# Patient Record
Sex: Male | Born: 1975
Health system: Southern US, Community
[De-identification: ages and names within clinical notes are randomized; demographics above are authoritative.]

## PROBLEM LIST (undated history)

## (undated) DIAGNOSIS — C6290 Malignant neoplasm of unspecified testis, unspecified whether descended or undescended: Secondary | ICD-10-CM

## (undated) DIAGNOSIS — M1990 Unspecified osteoarthritis, unspecified site: Secondary | ICD-10-CM

## (undated) HISTORY — DX: Unspecified osteoarthritis, unspecified site: M19.90

## (undated) HISTORY — DX: Malignant neoplasm of unspecified testis, unspecified whether descended or undescended: C62.90

## (undated) HISTORY — PX: HYDROCELE EXCISION / REPAIR: SUR1145

## (undated) HISTORY — PX: HYPOSPADIAS CORRECTION: SHX483

---
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
## 1997-11-03 ENCOUNTER — Ambulatory Visit (HOSPITAL_COMMUNITY): Admission: RE | Admit: 1997-11-03 | Discharge: 1997-11-03 | Payer: Self-pay | Admitting: Otolaryngology

## 1998-04-21 HISTORY — PX: CHOLESTEATOMA EXCISION: SHX1345

## 2005-04-08 ENCOUNTER — Ambulatory Visit (HOSPITAL_COMMUNITY): Admission: RE | Admit: 2005-04-08 | Discharge: 2005-04-08 | Payer: Self-pay | Admitting: Otolaryngology

## 2005-04-08 ENCOUNTER — Encounter: Admission: RE | Admit: 2005-04-08 | Discharge: 2005-04-08 | Payer: Self-pay | Admitting: Otolaryngology

## 2005-04-11 ENCOUNTER — Encounter (INDEPENDENT_AMBULATORY_CARE_PROVIDER_SITE_OTHER): Payer: Self-pay | Admitting: *Deleted

## 2005-04-11 ENCOUNTER — Ambulatory Visit (HOSPITAL_COMMUNITY): Admission: RE | Admit: 2005-04-11 | Discharge: 2005-04-11 | Payer: Self-pay | Admitting: Otolaryngology

## 2005-04-11 ENCOUNTER — Ambulatory Visit (HOSPITAL_BASED_OUTPATIENT_CLINIC_OR_DEPARTMENT_OTHER): Admission: RE | Admit: 2005-04-11 | Discharge: 2005-04-12 | Payer: Self-pay | Admitting: Otolaryngology

## 2005-12-06 ENCOUNTER — Emergency Department (HOSPITAL_COMMUNITY): Admission: EM | Admit: 2005-12-06 | Discharge: 2005-12-06 | Payer: Self-pay | Admitting: Emergency Medicine

## 2006-04-21 HISTORY — PX: OTHER SURGICAL HISTORY: SHX169

## 2006-07-03 ENCOUNTER — Encounter: Admission: RE | Admit: 2006-07-03 | Discharge: 2006-07-03 | Payer: Self-pay | Admitting: Urology

## 2006-07-07 ENCOUNTER — Encounter (INDEPENDENT_AMBULATORY_CARE_PROVIDER_SITE_OTHER): Payer: Self-pay | Admitting: *Deleted

## 2006-07-07 ENCOUNTER — Ambulatory Visit (HOSPITAL_BASED_OUTPATIENT_CLINIC_OR_DEPARTMENT_OTHER): Admission: RE | Admit: 2006-07-07 | Discharge: 2006-07-07 | Payer: Self-pay | Admitting: Urology

## 2006-07-13 ENCOUNTER — Ambulatory Visit: Payer: Self-pay | Admitting: Oncology

## 2006-08-05 LAB — CBC WITH DIFFERENTIAL/PLATELET
Eosinophils Absolute: 0.1 10*3/uL (ref 0.0–0.5)
MCV: 93.6 fL (ref 81.6–98.0)
MONO#: 0.3 10*3/uL (ref 0.1–0.9)
MONO%: 6 % (ref 0.0–13.0)
NEUT#: 3.8 10*3/uL (ref 1.5–6.5)
RBC: 4.91 10*6/uL (ref 4.20–5.71)
RDW: 14.1 % (ref 11.2–14.6)
WBC: 5.6 10*3/uL (ref 4.0–10.0)

## 2006-08-07 LAB — COMPREHENSIVE METABOLIC PANEL
Albumin: 4.1 g/dL (ref 3.5–5.2)
Alkaline Phosphatase: 135 U/L — ABNORMAL HIGH (ref 39–117)
Glucose, Bld: 98 mg/dL (ref 70–99)
Potassium: 4.4 mEq/L (ref 3.5–5.3)
Sodium: 141 mEq/L (ref 135–145)
Total Protein: 7.2 g/dL (ref 6.0–8.3)

## 2006-08-07 LAB — AFP TUMOR MARKER: AFP-Tumor Marker: 85.8 ng/mL — ABNORMAL HIGH (ref 0.0–8.0)

## 2006-08-12 ENCOUNTER — Ambulatory Visit (HOSPITAL_COMMUNITY): Admission: RE | Admit: 2006-08-12 | Discharge: 2006-08-12 | Payer: Self-pay | Admitting: Oncology

## 2006-08-21 LAB — CBC WITH DIFFERENTIAL/PLATELET
BASO%: 1.6 % (ref 0.0–2.0)
MCHC: 35.4 g/dL (ref 32.0–35.9)
MONO#: 0.3 10*3/uL (ref 0.1–0.9)
NEUT#: 2.6 10*3/uL (ref 1.5–6.5)
RBC: 4.82 10*6/uL (ref 4.20–5.71)
RDW: 14.5 % (ref 11.2–14.6)
WBC: 4.2 10*3/uL (ref 4.0–10.0)
lymph#: 1.1 10*3/uL (ref 0.9–3.3)

## 2006-08-24 LAB — COMPREHENSIVE METABOLIC PANEL
ALT: 31 U/L (ref 0–53)
Albumin: 4 g/dL (ref 3.5–5.2)
CO2: 23 mEq/L (ref 19–32)
Calcium: 9.1 mg/dL (ref 8.4–10.5)
Chloride: 107 mEq/L (ref 96–112)
Potassium: 4.4 mEq/L (ref 3.5–5.3)
Sodium: 143 mEq/L (ref 135–145)
Total Bilirubin: 0.9 mg/dL (ref 0.3–1.2)
Total Protein: 7.1 g/dL (ref 6.0–8.3)

## 2006-08-24 LAB — LACTATE DEHYDROGENASE: LDH: 160 U/L (ref 94–250)

## 2006-08-27 ENCOUNTER — Ambulatory Visit: Payer: Self-pay | Admitting: Oncology

## 2006-09-15 LAB — CBC WITH DIFFERENTIAL/PLATELET
Basophils Absolute: 0 10*3/uL (ref 0.0–0.1)
Eosinophils Absolute: 0 10*3/uL (ref 0.0–0.5)
LYMPH%: 16.6 % (ref 14.0–48.0)
MCH: 32.6 pg (ref 28.0–33.4)
MCV: 92.8 fL (ref 81.6–98.0)
MONO%: 5 % (ref 0.0–13.0)
NEUT#: 6.8 10*3/uL — ABNORMAL HIGH (ref 1.5–6.5)
Platelets: 415 10*3/uL — ABNORMAL HIGH (ref 145–400)
RBC: 4.23 10*6/uL (ref 4.20–5.71)

## 2006-09-18 LAB — COMPREHENSIVE METABOLIC PANEL
Albumin: 3.9 g/dL (ref 3.5–5.2)
Alkaline Phosphatase: 167 U/L — ABNORMAL HIGH (ref 39–117)
BUN: 15 mg/dL (ref 6–23)
Creatinine, Ser: 0.97 mg/dL (ref 0.40–1.50)
Glucose, Bld: 52 mg/dL — ABNORMAL LOW (ref 70–99)
Potassium: 4.3 mEq/L (ref 3.5–5.3)

## 2006-09-18 LAB — BETA HCG QUANT (REF LAB): Beta hCG, Tumor Marker: 1 m[IU]/mL (ref <5.0)

## 2006-09-18 LAB — AFP TUMOR MARKER: AFP-Tumor Marker: 5.5 ng/mL (ref 0.0–8.0)

## 2006-09-20 ENCOUNTER — Observation Stay (HOSPITAL_COMMUNITY): Admission: EM | Admit: 2006-09-20 | Discharge: 2006-09-21 | Payer: Self-pay | Admitting: Emergency Medicine

## 2006-09-20 ENCOUNTER — Ambulatory Visit: Payer: Self-pay | Admitting: Oncology

## 2006-10-02 LAB — CBC WITH DIFFERENTIAL/PLATELET
BASO%: 0.1 % (ref 0.0–2.0)
Basophils Absolute: 0 10*3/uL (ref 0.0–0.1)
EOS%: 0.2 % (ref 0.0–7.0)
HGB: 13.8 g/dL (ref 13.0–17.1)
MCH: 33 pg (ref 28.0–33.4)
MCHC: 34.9 g/dL (ref 32.0–35.9)
RDW: 16.9 % — ABNORMAL HIGH (ref 11.2–14.6)
WBC: 15.9 10*3/uL — ABNORMAL HIGH (ref 4.0–10.0)
lymph#: 1.8 10*3/uL (ref 0.9–3.3)

## 2006-10-04 LAB — COMPREHENSIVE METABOLIC PANEL
ALT: 25 U/L (ref 0–53)
Alkaline Phosphatase: 164 U/L — ABNORMAL HIGH (ref 39–117)
CO2: 30 mEq/L (ref 19–32)
Sodium: 140 mEq/L (ref 135–145)
Total Bilirubin: 0.3 mg/dL (ref 0.3–1.2)
Total Protein: 6.4 g/dL (ref 6.0–8.3)

## 2006-10-04 LAB — LACTATE DEHYDROGENASE: LDH: 209 U/L (ref 94–250)

## 2006-11-03 ENCOUNTER — Ambulatory Visit (HOSPITAL_COMMUNITY): Admission: RE | Admit: 2006-11-03 | Discharge: 2006-11-03 | Payer: Self-pay | Admitting: Oncology

## 2006-11-04 ENCOUNTER — Ambulatory Visit: Payer: Self-pay | Admitting: Oncology

## 2006-11-06 LAB — CBC WITH DIFFERENTIAL/PLATELET
Basophils Absolute: 0 10*3/uL (ref 0.0–0.1)
Eosinophils Absolute: 0 10*3/uL (ref 0.0–0.5)
HGB: 13.6 g/dL (ref 13.0–17.1)
LYMPH%: 29.3 % (ref 14.0–48.0)
MCV: 96 fL (ref 81.6–98.0)
MONO%: 11.9 % (ref 0.0–13.0)
NEUT#: 2.4 10*3/uL (ref 1.5–6.5)
NEUT%: 57.6 % (ref 40.0–75.0)
Platelets: 365 10*3/uL (ref 145–400)

## 2006-11-08 LAB — LACTATE DEHYDROGENASE: LDH: 161 U/L (ref 94–250)

## 2006-11-08 LAB — COMPREHENSIVE METABOLIC PANEL
ALT: 27 U/L (ref 0–53)
Albumin: 4.3 g/dL (ref 3.5–5.2)
Alkaline Phosphatase: 100 U/L (ref 39–117)
BUN: 13 mg/dL (ref 6–23)
Calcium: 9.3 mg/dL (ref 8.4–10.5)
Glucose, Bld: 81 mg/dL (ref 70–99)
Total Bilirubin: 0.4 mg/dL (ref 0.3–1.2)
Total Protein: 6.5 g/dL (ref 6.0–8.3)

## 2006-11-08 LAB — BETA HCG QUANT (REF LAB): Beta hCG, Tumor Marker: 0.7 m[IU]/mL (ref <5.0)

## 2007-01-21 ENCOUNTER — Ambulatory Visit: Payer: Self-pay | Admitting: Oncology

## 2007-01-25 ENCOUNTER — Ambulatory Visit (HOSPITAL_COMMUNITY): Admission: RE | Admit: 2007-01-25 | Discharge: 2007-01-25 | Payer: Self-pay | Admitting: Oncology

## 2007-01-25 LAB — CBC WITH DIFFERENTIAL/PLATELET
BASO%: 1.3 % (ref 0.0–2.0)
EOS%: 1 % (ref 0.0–7.0)
LYMPH%: 38.5 % (ref 14.0–48.0)
MCH: 32.4 pg (ref 28.0–33.4)
MCHC: 35 g/dL (ref 32.0–35.9)
MONO#: 0.2 10*3/uL (ref 0.1–0.9)
Platelets: 274 10*3/uL (ref 145–400)
RBC: 4.82 10*6/uL (ref 4.20–5.71)
WBC: 2.7 10*3/uL — ABNORMAL LOW (ref 4.0–10.0)

## 2007-01-25 LAB — COMPREHENSIVE METABOLIC PANEL
ALT: 36 U/L (ref 0–53)
AST: 41 U/L — ABNORMAL HIGH (ref 0–37)
Alkaline Phosphatase: 113 U/L (ref 39–117)
CO2: 32 mEq/L (ref 19–32)
Sodium: 139 mEq/L (ref 135–145)
Total Bilirubin: 0.5 mg/dL (ref 0.3–1.2)
Total Protein: 7.2 g/dL (ref 6.0–8.3)

## 2007-03-14 ENCOUNTER — Ambulatory Visit: Payer: Self-pay | Admitting: Oncology

## 2007-04-21 ENCOUNTER — Ambulatory Visit: Payer: Self-pay | Admitting: Oncology

## 2007-04-26 ENCOUNTER — Ambulatory Visit (HOSPITAL_COMMUNITY): Admission: RE | Admit: 2007-04-26 | Discharge: 2007-04-26 | Payer: Self-pay | Admitting: Oncology

## 2007-04-26 LAB — COMPREHENSIVE METABOLIC PANEL
Albumin: 4 g/dL (ref 3.5–5.2)
Alkaline Phosphatase: 136 U/L — ABNORMAL HIGH (ref 39–117)
BUN: 12 mg/dL (ref 6–23)
CO2: 32 mEq/L (ref 19–32)
Glucose, Bld: 88 mg/dL (ref 70–99)
Total Bilirubin: 1 mg/dL (ref 0.3–1.2)
Total Protein: 7.7 g/dL (ref 6.0–8.3)

## 2007-04-26 LAB — LACTATE DEHYDROGENASE: LDH: 142 U/L (ref 94–250)

## 2007-04-26 LAB — CBC WITH DIFFERENTIAL/PLATELET
Eosinophils Absolute: 0 10*3/uL (ref 0.0–0.5)
HCT: 46.1 % (ref 38.7–49.9)
LYMPH%: 27.9 % (ref 14.0–48.0)
MONO#: 0.3 10*3/uL (ref 0.1–0.9)
NEUT#: 2.5 10*3/uL (ref 1.5–6.5)
NEUT%: 62.8 % (ref 40.0–75.0)
Platelets: 280 10*3/uL (ref 145–400)
WBC: 4 10*3/uL (ref 4.0–10.0)

## 2007-04-28 LAB — AFP TUMOR MARKER: AFP-Tumor Marker: 3.3 ng/mL (ref 0.0–8.0)

## 2007-06-16 ENCOUNTER — Ambulatory Visit: Payer: Self-pay | Admitting: Oncology

## 2007-07-27 ENCOUNTER — Ambulatory Visit: Payer: Self-pay | Admitting: Oncology

## 2007-07-29 LAB — CBC WITH DIFFERENTIAL/PLATELET
Eosinophils Absolute: 0 10*3/uL (ref 0.0–0.5)
HCT: 44.3 % (ref 38.7–49.9)
LYMPH%: 32.5 % (ref 14.0–48.0)
MCV: 94.7 fL (ref 81.6–98.0)
MONO%: 6.3 % (ref 0.0–13.0)
NEUT#: 2.8 10*3/uL (ref 1.5–6.5)
NEUT%: 60.4 % (ref 40.0–75.0)
Platelets: 225 10*3/uL (ref 145–400)
RBC: 4.68 10*6/uL (ref 4.20–5.71)

## 2007-07-31 LAB — COMPREHENSIVE METABOLIC PANEL
Alkaline Phosphatase: 117 U/L (ref 39–117)
BUN: 16 mg/dL (ref 6–23)
Creatinine, Ser: 0.91 mg/dL (ref 0.40–1.50)
Glucose, Bld: 107 mg/dL — ABNORMAL HIGH (ref 70–99)
Sodium: 141 mEq/L (ref 135–145)
Total Bilirubin: 0.4 mg/dL (ref 0.3–1.2)
Total Protein: 7.2 g/dL (ref 6.0–8.3)

## 2007-07-31 LAB — AFP TUMOR MARKER: AFP-Tumor Marker: 2.6 ng/mL (ref 0.0–8.0)

## 2007-07-31 LAB — LACTATE DEHYDROGENASE: LDH: 148 U/L (ref 94–250)

## 2007-07-31 LAB — BETA HCG QUANT (REF LAB): Beta hCG, Tumor Marker: 0.5 m[IU]/mL (ref <5.0)

## 2007-09-16 ENCOUNTER — Ambulatory Visit: Payer: Self-pay | Admitting: Oncology

## 2007-11-01 ENCOUNTER — Ambulatory Visit: Payer: Self-pay | Admitting: Oncology

## 2007-11-04 LAB — CBC WITH DIFFERENTIAL/PLATELET
Basophils Absolute: 0 10*3/uL (ref 0.0–0.1)
EOS%: 1.4 % (ref 0.0–7.0)
Eosinophils Absolute: 0 10*3/uL (ref 0.0–0.5)
HCT: 42.9 % (ref 38.7–49.9)
HGB: 15 g/dL (ref 13.0–17.1)
MCH: 33.9 pg — ABNORMAL HIGH (ref 28.0–33.4)
NEUT%: 49.3 % (ref 40.0–75.0)
lymph#: 1.5 10*3/uL (ref 0.9–3.3)

## 2007-11-07 LAB — COMPREHENSIVE METABOLIC PANEL
AST: 29 U/L (ref 0–37)
BUN: 16 mg/dL (ref 6–23)
CO2: 30 mEq/L (ref 19–32)
Calcium: 9 mg/dL (ref 8.4–10.5)
Chloride: 103 mEq/L (ref 96–112)
Creatinine, Ser: 1.46 mg/dL (ref 0.40–1.50)
Glucose, Bld: 79 mg/dL (ref 70–99)

## 2007-11-07 LAB — LACTATE DEHYDROGENASE: LDH: 146 U/L (ref 94–250)

## 2007-11-07 LAB — AFP TUMOR MARKER: AFP-Tumor Marker: 2.2 ng/mL (ref 0.0–8.0)

## 2007-11-08 ENCOUNTER — Ambulatory Visit (HOSPITAL_COMMUNITY): Admission: RE | Admit: 2007-11-08 | Discharge: 2007-11-08 | Payer: Self-pay | Admitting: Oncology

## 2007-11-16 ENCOUNTER — Ambulatory Visit (HOSPITAL_COMMUNITY): Admission: RE | Admit: 2007-11-16 | Discharge: 2007-11-16 | Payer: Self-pay | Admitting: Oncology

## 2008-02-08 ENCOUNTER — Ambulatory Visit: Payer: Self-pay | Admitting: Oncology

## 2008-02-10 LAB — CBC WITH DIFFERENTIAL/PLATELET
BASO%: 0.6 % (ref 0.0–2.0)
Eosinophils Absolute: 0 10*3/uL (ref 0.0–0.5)
HCT: 43.3 % (ref 38.7–49.9)
MCHC: 35 g/dL (ref 32.0–35.9)
MONO#: 0.2 10*3/uL (ref 0.1–0.9)
NEUT#: 1.6 10*3/uL (ref 1.5–6.5)
NEUT%: 60.7 % (ref 40.0–75.0)
RBC: 4.42 10*6/uL (ref 4.20–5.71)
WBC: 2.6 10*3/uL — ABNORMAL LOW (ref 4.0–10.0)
lymph#: 0.8 10*3/uL — ABNORMAL LOW (ref 0.9–3.3)

## 2008-02-13 LAB — COMPREHENSIVE METABOLIC PANEL
ALT: 36 U/L (ref 0–53)
CO2: 30 mEq/L (ref 19–32)
Calcium: 9.3 mg/dL (ref 8.4–10.5)
Chloride: 103 mEq/L (ref 96–112)
Glucose, Bld: 62 mg/dL — ABNORMAL LOW (ref 70–99)
Sodium: 142 mEq/L (ref 135–145)
Total Protein: 6.7 g/dL (ref 6.0–8.3)

## 2008-02-13 LAB — BETA HCG QUANT (REF LAB): Beta hCG, Tumor Marker: 0.5 m[IU]/mL (ref <5.0)

## 2008-02-13 LAB — LACTATE DEHYDROGENASE: LDH: 135 U/L (ref 94–250)

## 2008-05-05 ENCOUNTER — Ambulatory Visit: Payer: Self-pay | Admitting: Oncology

## 2008-05-11 ENCOUNTER — Ambulatory Visit (HOSPITAL_COMMUNITY): Admission: RE | Admit: 2008-05-11 | Discharge: 2008-05-11 | Payer: Self-pay | Admitting: Oncology

## 2008-09-11 ENCOUNTER — Ambulatory Visit: Payer: Self-pay | Admitting: Oncology

## 2008-09-13 ENCOUNTER — Ambulatory Visit (HOSPITAL_COMMUNITY): Admission: RE | Admit: 2008-09-13 | Discharge: 2008-09-13 | Payer: Self-pay | Admitting: Oncology

## 2008-09-13 LAB — CBC WITH DIFFERENTIAL/PLATELET
BASO%: 1.1 % (ref 0.0–2.0)
HCT: 43.8 % (ref 38.4–49.9)
LYMPH%: 35.5 % (ref 14.0–49.0)
MCHC: 35.6 g/dL (ref 32.0–36.0)
MCV: 93.8 fL (ref 79.3–98.0)
MONO#: 0.2 10*3/uL (ref 0.1–0.9)
MONO%: 7.5 % (ref 0.0–14.0)
NEUT%: 54.8 % (ref 39.0–75.0)
Platelets: 230 10*3/uL (ref 140–400)
WBC: 2.8 10*3/uL — ABNORMAL LOW (ref 4.0–10.3)

## 2008-09-15 LAB — COMPREHENSIVE METABOLIC PANEL
ALT: 35 U/L (ref 0–53)
CO2: 28 mEq/L (ref 19–32)
Creatinine, Ser: 0.98 mg/dL (ref 0.40–1.50)
Glucose, Bld: 89 mg/dL (ref 70–99)
Total Bilirubin: 0.6 mg/dL (ref 0.3–1.2)

## 2008-09-15 LAB — BETA HCG QUANT (REF LAB): Beta hCG, Tumor Marker: 0.5 m[IU]/mL (ref <5.0)

## 2008-09-15 LAB — LACTATE DEHYDROGENASE: LDH: 150 U/L (ref 94–250)

## 2008-09-15 LAB — AFP TUMOR MARKER: AFP-Tumor Marker: 2.3 ng/mL (ref 0.0–8.0)

## 2009-01-16 ENCOUNTER — Ambulatory Visit: Payer: Self-pay | Admitting: Oncology

## 2009-01-18 ENCOUNTER — Ambulatory Visit (HOSPITAL_COMMUNITY): Admission: RE | Admit: 2009-01-18 | Discharge: 2009-01-18 | Payer: Self-pay | Admitting: Oncology

## 2009-01-18 LAB — CBC WITH DIFFERENTIAL/PLATELET
BASO%: 1.3 % (ref 0.0–2.0)
EOS%: 0.8 % (ref 0.0–7.0)
MCH: 34.1 pg — ABNORMAL HIGH (ref 27.2–33.4)
MCHC: 34.6 g/dL (ref 32.0–36.0)
MONO#: 0.2 10*3/uL (ref 0.1–0.9)
RBC: 4.4 10*6/uL (ref 4.20–5.82)
RDW: 14.1 % (ref 11.0–14.6)
WBC: 2.5 10*3/uL — ABNORMAL LOW (ref 4.0–10.3)
lymph#: 0.9 10*3/uL (ref 0.9–3.3)

## 2009-01-20 LAB — COMPREHENSIVE METABOLIC PANEL
ALT: 50 U/L (ref 0–53)
AST: 39 U/L — ABNORMAL HIGH (ref 0–37)
Albumin: 4.4 g/dL (ref 3.5–5.2)
CO2: 29 mEq/L (ref 19–32)
Calcium: 9.1 mg/dL (ref 8.4–10.5)
Chloride: 103 mEq/L (ref 96–112)
Creatinine, Ser: 0.92 mg/dL (ref 0.40–1.50)
Potassium: 4.7 mEq/L (ref 3.5–5.3)
Sodium: 140 mEq/L (ref 135–145)
Total Protein: 6.6 g/dL (ref 6.0–8.3)

## 2009-04-19 ENCOUNTER — Encounter: Admission: RE | Admit: 2009-04-19 | Discharge: 2009-04-19 | Payer: Self-pay | Admitting: Family Medicine

## 2009-05-15 ENCOUNTER — Ambulatory Visit: Payer: Self-pay | Admitting: Oncology

## 2009-05-17 ENCOUNTER — Ambulatory Visit (HOSPITAL_COMMUNITY): Admission: RE | Admit: 2009-05-17 | Discharge: 2009-05-17 | Payer: Self-pay | Admitting: Oncology

## 2009-05-17 LAB — CBC WITH DIFFERENTIAL/PLATELET
BASO%: 1 % (ref 0.0–2.0)
Basophils Absolute: 0 10*3/uL (ref 0.0–0.1)
EOS%: 1.1 % (ref 0.0–7.0)
HGB: 15.6 g/dL (ref 13.0–17.1)
MCH: 34.3 pg — ABNORMAL HIGH (ref 27.2–33.4)
MCHC: 34.5 g/dL (ref 32.0–36.0)
MCV: 99.3 fL — ABNORMAL HIGH (ref 79.3–98.0)
NEUT#: 1.5 10*3/uL (ref 1.5–6.5)
NEUT%: 55.1 % (ref 39.0–75.0)
Platelets: 252 10*3/uL (ref 140–400)
WBC: 2.8 10*3/uL — ABNORMAL LOW (ref 4.0–10.3)

## 2009-05-20 LAB — COMPREHENSIVE METABOLIC PANEL
AST: 39 U/L — ABNORMAL HIGH (ref 0–37)
Alkaline Phosphatase: 104 U/L (ref 39–117)
BUN: 18 mg/dL (ref 6–23)
Calcium: 9.5 mg/dL (ref 8.4–10.5)
Creatinine, Ser: 0.89 mg/dL (ref 0.40–1.50)
Glucose, Bld: 78 mg/dL (ref 70–99)

## 2009-05-20 LAB — LACTATE DEHYDROGENASE: LDH: 165 U/L (ref 94–250)

## 2009-09-11 ENCOUNTER — Ambulatory Visit: Payer: Self-pay | Admitting: Oncology

## 2009-09-12 ENCOUNTER — Ambulatory Visit (HOSPITAL_COMMUNITY): Admission: RE | Admit: 2009-09-12 | Discharge: 2009-09-12 | Payer: Self-pay | Admitting: Oncology

## 2009-09-12 LAB — CBC WITH DIFFERENTIAL/PLATELET
BASO%: 0.9 % (ref 0.0–2.0)
EOS%: 1.2 % (ref 0.0–7.0)
HCT: 44.1 % (ref 38.4–49.9)
LYMPH%: 27.3 % (ref 14.0–49.0)
MCH: 34.4 pg — ABNORMAL HIGH (ref 27.2–33.4)
MCHC: 35.1 g/dL (ref 32.0–36.0)
MCV: 97.9 fL (ref 79.3–98.0)
MONO#: 0.3 10*3/uL (ref 0.1–0.9)
MONO%: 10.1 % (ref 0.0–14.0)
NEUT%: 60.5 % (ref 39.0–75.0)
Platelets: 215 10*3/uL (ref 140–400)
RBC: 4.5 10*6/uL (ref 4.20–5.82)
WBC: 2.5 10*3/uL — ABNORMAL LOW (ref 4.0–10.3)

## 2009-09-15 LAB — COMPREHENSIVE METABOLIC PANEL
ALT: 33 U/L (ref 0–53)
AST: 38 U/L — ABNORMAL HIGH (ref 0–37)
Alkaline Phosphatase: 86 U/L (ref 39–117)
Creatinine, Ser: 0.93 mg/dL (ref 0.40–1.50)
Sodium: 139 mEq/L (ref 135–145)
Total Bilirubin: 0.6 mg/dL (ref 0.3–1.2)
Total Protein: 6.6 g/dL (ref 6.0–8.3)

## 2009-09-15 LAB — LACTATE DEHYDROGENASE: LDH: 147 U/L (ref 94–250)

## 2010-01-07 ENCOUNTER — Ambulatory Visit: Payer: Self-pay | Admitting: Oncology

## 2010-01-10 LAB — CBC WITH DIFFERENTIAL/PLATELET
BASO%: 1.4 % (ref 0.0–2.0)
Basophils Absolute: 0 10*3/uL (ref 0.0–0.1)
HCT: 45.6 % (ref 38.4–49.9)
HGB: 15.7 g/dL (ref 13.0–17.1)
MONO#: 0.3 10*3/uL (ref 0.1–0.9)
NEUT%: 56.6 % (ref 39.0–75.0)
RDW: 13.8 % (ref 11.0–14.6)
WBC: 3.4 10*3/uL — ABNORMAL LOW (ref 4.0–10.3)
lymph#: 1.1 10*3/uL (ref 0.9–3.3)

## 2010-01-12 LAB — COMPREHENSIVE METABOLIC PANEL
ALT: 28 U/L (ref 0–53)
Albumin: 4.2 g/dL (ref 3.5–5.2)
BUN: 16 mg/dL (ref 6–23)
CO2: 30 mEq/L (ref 19–32)
Calcium: 9.7 mg/dL (ref 8.4–10.5)
Chloride: 103 mEq/L (ref 96–112)
Creatinine, Ser: 0.9 mg/dL (ref 0.40–1.50)
Potassium: 4.5 mEq/L (ref 3.5–5.3)

## 2010-01-12 LAB — LACTATE DEHYDROGENASE: LDH: 155 U/L (ref 94–250)

## 2010-05-07 ENCOUNTER — Ambulatory Visit: Payer: Self-pay | Admitting: Oncology

## 2010-05-09 ENCOUNTER — Ambulatory Visit (HOSPITAL_COMMUNITY)
Admission: RE | Admit: 2010-05-09 | Discharge: 2010-05-09 | Payer: Self-pay | Source: Home / Self Care | Attending: Oncology | Admitting: Oncology

## 2010-05-09 LAB — CBC WITH DIFFERENTIAL/PLATELET
BASO%: 0.8 % (ref 0.0–2.0)
Basophils Absolute: 0 10*3/uL (ref 0.0–0.1)
EOS%: 0.9 % (ref 0.0–7.0)
Eosinophils Absolute: 0 10*3/uL (ref 0.0–0.5)
HCT: 44.9 % (ref 38.4–49.9)
HGB: 15.4 g/dL (ref 13.0–17.1)
LYMPH%: 35.8 % (ref 14.0–49.0)
MCH: 33.8 pg — ABNORMAL HIGH (ref 27.2–33.4)
MCHC: 34.3 g/dL (ref 32.0–36.0)
MCV: 98.4 fL — ABNORMAL HIGH (ref 79.3–98.0)
MONO#: 0.2 10*3/uL (ref 0.1–0.9)
MONO%: 7.5 % (ref 0.0–14.0)
NEUT#: 1.7 10*3/uL (ref 1.5–6.5)
NEUT%: 55 % (ref 39.0–75.0)
Platelets: 256 10*3/uL (ref 140–400)
RBC: 4.57 10*6/uL (ref 4.20–5.82)
RDW: 13.5 % (ref 11.0–14.6)
WBC: 3.1 10*3/uL — ABNORMAL LOW (ref 4.0–10.3)
lymph#: 1.1 10*3/uL (ref 0.9–3.3)

## 2010-05-12 LAB — COMPREHENSIVE METABOLIC PANEL WITH GFR
ALT: 29 U/L (ref 0–53)
AST: 37 U/L (ref 0–37)
Albumin: 4.4 g/dL (ref 3.5–5.2)
Alkaline Phosphatase: 100 U/L (ref 39–117)
BUN: 13 mg/dL (ref 6–23)
CO2: 28 meq/L (ref 19–32)
Calcium: 9.5 mg/dL (ref 8.4–10.5)
Chloride: 102 meq/L (ref 96–112)
Creatinine, Ser: 0.89 mg/dL (ref 0.40–1.50)
Glucose, Bld: 81 mg/dL (ref 70–99)
Potassium: 4.3 meq/L (ref 3.5–5.3)
Sodium: 142 meq/L (ref 135–145)
Total Bilirubin: 0.5 mg/dL (ref 0.3–1.2)
Total Protein: 6.9 g/dL (ref 6.0–8.3)

## 2010-05-12 LAB — LACTATE DEHYDROGENASE: LDH: 150 U/L (ref 94–250)

## 2010-05-12 LAB — AFP TUMOR MARKER: AFP-Tumor Marker: 3.6 ng/mL (ref 0.0–8.0)

## 2010-09-03 NOTE — H&P (Signed)
NAME:  Justin Savage, Justin Savage NO.:  [id number]   MEDICAL RECORD NO.:  [id number]          PATIENT TYPE:  INP   LOCATION:  1316                         FACILITY:  [facility]   PHYSICIAN:  Genene Churn. Granfortuna, M.D.DATE OF BIRTH:  02-Apr-1976   DATE OF ADMISSION:  09/20/2006  DATE OF DISCHARGE:                              HISTORY & PHYSICAL   REASON FOR ADMISSION:  Refractory vomiting and a man on chemotherapy for  recently diagnosed testicular cancer.   HISTORY OF PRESENT ILLNESS:  [name] with a history  of an undescended testicle and Down syndrome.  He presented with a  tender left testicular mass in March of this year.  Ultrasound showed a  7 x 5 x 6 cm solid mass.  Tumor markers were obtained.  Alpha-  fetoprotein was markedly elevated at 3284.  HCG was undetectable.  Serum  LDH was normal.  He underwent a left radical orchiectomy by Dr. Bjorn Pippin on July 07, 2006.  Pathology showed an 8.6 cm mixed germ cell  tumor, 50% embryonal components, 20% seminoma, 20% ILK sac tumor, 10%  immature teratoma.  Positive vascular and lymphatic invasion.  Pathologic T2.  Staging evaluation with CT scan of chest, abdomen and  pelvis showed non bulky minimal periaortic lymphadenopathy with two  lymph nodes 2 x 1.6 and 1.4 cm.  In addition, there was a 1.7 cm left  common femoral lymph node.   A standard chemotherapy program was initiated with cisplatinum,  etoposide, and bleomycin on Aug 24, 2006.  He tolerated the first cycle  well.  A second cycle was given May 27-30, 2008, with Neulasta support.  He now presents with a 24-hour history of delayed vomiting with  persistent dry heaves, refractory to outpatient antiemetics.  He is  admitted for hydration and parenteral antiemetics at this time.   PAST SURGICAL HISTORY:  1. Includes previous surgery for an undescended testicle.  His mother      believes it was the left testicle that was not descended, but she  is not sure.  2. Hypospadias repair.  3. Hydrocele repair.  4. Club foot repair.   PAST SURGICAL HISTORY:  No active medical problems.   MEDICATIONS:  No chronic medications.   ALLERGIES:  No allergies.   FAMILY HISTORY:  Father with a treated prostate cancer.   SOCIAL HISTORY:  He has Down syndrome.  He lives with his parents.  He  is single.  No alcohol or tobacco.   REVIEW OF SYSTEMS:  He has had some dizziness over the last 24 hours.  No fever.  No abdominal pain.  He has had some intermittent diarrhea  over the last 24 hours.   PHYSICAL EXAMINATION:  GENERAL:  [name] who is retching during  the exam.  VITAL SIGNS:  Pulse 101 regular, blood pressure 118/78, respirations 22,  temperature 97.6.  HEENT:  Skin, hair and nails normal.  Pupils equal, reactive to light.  Pharynx no erythema or exudate.  NECK:  Supple.  LUNGS:  Clear, resonant to percussion.  HEART:  Regular cardiac rhythm.  No  murmur.  ABDOMEN:  Soft and nontender.  No masses or organomegaly.  Scar in the  left inguinal region status post orchiectomy.  EXTREMITIES:  No edema.  No calf tenderness.  NEUROLOGIC: Cranial nerves grossly normal motor strength 5/5.  Reflexes  1+ symmetric.   LABORATORY DATA:  Labs in our office this Tuesday on May 27 with  hemoglobin 14, white count 8700, platelets 415,000, BUN 15, creatinine  1.0.   IMPRESSION:  1. Delayed nausea and vomiting from recent chemotherapy for testicular      cancer.  2. 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  3. Down syndrome.   PLAN:  Parenteral hydration and antiemetics until stable.  Add an  antiemetic that may prevent delayed nausea and vomiting for his next  cycle such as Aloxi or Emend.      Genene Churn. Cyndie Chime, M.D.  Electronically Signed     JMG/MEDQ  D:  09/20/2006  T:  09/21/2006  Job:  [id number]   cc:   Oley Balm. Georgina Pillion, M.D.  Fax: [phone]   Excell Seltzer. Annabell Howells, M.D.  Fax: [phone]   Blenda Nicely. [employer]

## 2010-09-06 NOTE — Op Note (Signed)
NAME:  Justin Savage, Justin Savage            ACCOUNT NO.:  [id number]   MEDICAL RECORD NO.:  [id number]          PATIENT TYPE:  AMB   LOCATION:  NESC                         FACILITY:  [facility]   PHYSICIAN:  Excell Seltzer. Annabell Howells, M.D.    DATE OF BIRTH:  28-Mar-1976   DATE OF PROCEDURE:  07/07/2006  DATE OF DISCHARGE:                               OPERATIVE REPORT   PROCEDURE:  Left radical orchiectomy.   PREOPERATIVE DIAGNOSIS:  Left testicular tumor.   POSTOPERATIVE DIAGNOSIS:  Left testicular tumor.   SURGEON:  Excell Seltzer. Annabell Howells, M.D.   ANESTHESIA:  General.   SPECIMEN:  Left testicle and cord.   COMPLICATIONS:  None.   INDICATIONS:  Justin Savage is a 35 year old white male with Down's who noticed a  swelling and discomfort in his left scrotum in mid March.  He had not  noticed any abnormality prior, but in the office he was found to have a  large testicular mass which was confirmed to be solid on ultrasound  suspicious for neoplasm.  He has had tumor markers drawn. 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   FINDINGS AND PROCEDURE:  The patient was taken to the operating room  where general anesthetic was induced.  His lower abdomen and genitalia  were clipped.  He was prepped with Betadine solution and draped in the  usual sterile fashion.  An incision was made obliquely along the skin  lines over the left inguinal area approximately 5 cm in length.  Once  the skin was incised, the subcutaneous tissues were incised with the  Bovie.  A large superficial vein was clamped, divided, and tied with 3-0  chromic ties.  There had been prior surgery in the area for possibly an  orchiopexy, so the external oblique fascia was not completely normal;  however, cord was identified inferior to the external ring and encircled  and then clamped with a Penrose drain.  The testicle was then delivered  from the wound.   The gubernacular attachments were taken down with the  Bovie.  There were several large veins that were ligated with 3-0  chromic ties.  Once the testicle was freed up, we dissected proximally  toward the internal ring.  There was some scarring and old stitches  noted from his prior surgery.  The ilioinguinal nerve was identified and  was spared. Once the proximal dissection had been completed, the cord  was divided into 3 packets, clamped with hemostats, divided, and doubly  ligated with 0 Vicryl ties.  The ends were left long for future  identification if necessary.  At this point, the wound was inspected for  hemostasis.  No active bleeding was identified.  The external oblique  fascia was approximated as best as possible with interrupted 3-0 Vicryl  sutures, it was somewhat attenuated from the prior surgery.  10 mL of  0.25% Marcaine was then infiltrated into the margins of the incision.  A  subcutaneous layer of interrupted 3-0 Vicryl was placed.  The skin was  closed with running intracuticular 4-0 Vicryl.  The wound was  cleansed, dried, and reinforced with Steri-Strips. A dressing was  applied along with fluffed Kerlix and scrotal support.  The patient's  anesthetic was reversed and he was taken to the recovery room in stable  condition.  There were no complications.      Excell Seltzer. Annabell Howells, M.D.  Electronically Signed     JJW/MEDQ  D:  07/07/2006  T:  07/07/2006  Job:  [id number]   cc:   Oley Balm. Georgina Pillion, M.D.  Fax: [phone]

## 2010-09-06 NOTE — Op Note (Signed)
NAME:  Justin Savage, Justin Savage NO.:  [id number]   MEDICAL RECORD NO.:  [id number]          PATIENT TYPE:  AMB   LOCATION:  DSC                          FACILITY:  [facility]   PHYSICIAN:  Carolan Shiver, M.D.    DATE OF BIRTH:  1976-03-26   DATE OF PROCEDURE:  04/11/2005  DATE OF DISCHARGE:  04/11/2005                                 OPERATIVE REPORT   JUSTIFICATION FOR PROCEDURE:  Justin Savage is a 35 year old white  male with trisomy 21 whom I have been following for a long history of  chronic ear disease. He was recently found to have a left attic  cholesteatoma and this was documented on CT scan of his temporal bones. He  was recommended for a left canal up tympanomastoidectomy and possible  modified radical mastoidectomy, left ear. Preop audiometric testing on  April 08, 2005 documented an SRT of 25 dB with 100% discrimination. 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   PREOP LABORATORY DATA:  Showed hemoglobin of 16.6, hematocrit of 48.9, white  blood cell count of 4,000, PT of 13.6, INR 1.0, PTT 29. Metabolic panel  showed a slightly elevated potassium of 5.6, glucose of 101, SGPT of 45.  Urinalysis was unremarkable. He was known to have a hypospadias.   Risks and complications of tympanomastoidectomy were explained to his  mother. Questions were invited and answered and informed consent was signed  and witnessed.   Justification for outpatient setting is patient's age and need for general  endotracheal anesthesia.   Justification for overnight stay is:  1.  Twenty-three hours of observation of facial function status post      tympanomastoidectomy.  2.  The patient has trisomy 48.   PREOPERATIVE DIAGNOSES:  1.  Left middle ear and attic cholesteatoma.  2.  Trisomy 21.   POSTOPERATIVE DIAGNOSES:  1.  Left middle ear and attic cholesteatoma.  2.  Trisomy 21.   OPERATION:  Left canal up tympanomastoidectomy with excision of middle ear  and  attic cholesteatoma.   SURGEON:  Carolan Shiver, M.D.   ANESTHESIA:  General endotracheal, Dr. Adonis Huguenin.   COMPLICATIONS:  None.   SUMMARY OF REPORT:  After the patient was taken to the operating room he was  placed in the supine position and time out was performed. General mask  induction was performed and an IV was inserted. He was then orally  intubated, eyelids were taped shut, and he was properly positioned and  monitored. Elbows and ankles were padded with foam rubber. Silver wire and  needle electrodes were placed in the left orbicularis auris and oculi  muscles suprasternal notch area and connected to a NIM facial nerve monitor  preamplifier. A small amount of hair was clipped in the left postauricular  area. His hair was taped and stockinette cap was applied and his left  postauricular area was then marked and infiltrated with 5 mL of 1% Xylocaine  with 1:100,000 epinephrine. A #10 red rubber catheter was then inserted. The  patient was found to have a hypospadias condition. This was done  atraumatically.  The patient's left ear and hemiface was prepped with  Betadine and draped in a standard fashion for a tympanomastoidectomy.   Examination of the left ear canal revealed a 4 mm diameter canal. There was  a large amount of debris. This was cleaned. There was cholesteatoma debris  in the posterior superior quadrant and extending into the attic there was  some scutal erosion present. The area was meticulously cleaned and debrided.  Four quadrant ear canal blocks were performed with 1 mL of 2% Xylocaine with  1:50,000 epinephrine.   Left postauricular incision was then made and carried down through skin and  subcutaneous tissue. A T-shaped incision was made in the mastoid periosteum  and reflected. The mastoid cortex was then removed with cutting burs using  continuous suction irrigation. The patient was found to have a completely  sclerotic mastoid cavity. The complete  mastoidectomy was performed. The  Korner septum was found and removed. The inferior one-third of the mastoid  was filled with marrow. A complete mastoidectomy again was performed  removing the sclerotic bone and identifying the posterior canal wall, the  sigmoid sinus, and the digastric periosteum. There was a small amount of  dura exposed in the attic from some chronic granulation tissue that was  found in the antrum. This was removed. The dome of the lateral canal was  identified as was the short process of the incus. There was no cholesteatoma  found in the attic proper.   Posterior canal wall skin was then dissected medially. A canalplasty was  performed superiorly. Posterior superior bony canal wall was then curetted.  There was already some scutal erosion present. Middle ear was entered  without difficulty. The patient was found to have a type 3  myringostapediopexy. This was not disturbed. The facial nerve was identified  in the horizontal portion and was covered by bone. Reflecting the posterior  canal wall skin and looking through the ear canal again I could see that  there was a pocket that had extended into the attic up to the body of the  incus. The entire long process of the incus was gone. The pocket had  extended up medial to the partially eroded scutum and was abutting against  the eroded incus. I was able to remove the cholesteatoma from the pocket.  The cholesteatoma had not broken through into the attic. The body of the  incus was left attached to the malleus so as not to create more of an attic  defect. Once all the cholesteatoma had been cleaned the posterior canal wall  skin and tympanic membrane were allowed to lay back into their previous  position. There were no perforations. The area was copiously irrigated and  the medial portion of the canal was packed with Gelfoam disc soaked in Cipro HC. I should mention that the inferior attic as viewed through the canal   approach also did not show any cholesteatoma and saline irrigated freely  from the middle ear into the attic.   Some bleeding was controlled in the digastric tip with some bone wax. The  cavity was copiously irrigated with bacitracin containing saline as the  Penrose drain was placed. The medial ear canal was then packed with Gelfoam  disc soaked in Cipro HC and lateral ear canal was packed with quarter inch  iodoform Nugauze impregnated with bacitracin ointment. Postauricular  incision was then closed in 3 layers using interrupted inverted 3-0 chromics  for the mucoperiosteal layer, the same for the subcutaneous  layer, and skin  was closed with a running locking 5-0 Novofil. Prior to closing the skin and  the subcutaneous tissue the area was copiously irrigated with bacitracin  containing saline. Bacitracin ointment was applied to the incision. The area  was padded with Telfa and cotton and standard Glasscock mastoid dressing was  applied loosely in a standard fashion. The patient's red rubber catheter was  then removed from his bladder. He was awakened, extubated, and transferred  to his hospital bed. He appeared to tolerate the general endotracheal  anesthesia and the procedures well and left the operating room in stable  condition.   TOTAL FLUIDS:  1500 mL.   ESTIMATED BLOOD LOSS:  Less than 50 mL.   Sponge, needle, and cottonball counts were correct at termination of the  procedure.   Total urine output was 200 mL.   The patient received Ancef 1 gram IV, Zofran 4 mg IV at the beginning and  end of the procedure, and Decadron 10 mg IV.   Justin Savage will be admitted to the 23 hour operative recovery care unit for IV  hydration, pain control, and 23 hours of observation. If stable overnight he  will be discharged on April 12, 2005 with his parents who will be  instructed to return him to my office on April 18, 2005 at 3:40 p.m. for  follow up and suture removal.   DISCHARGE  MEDICATIONS:  1.  Ceftin 500 mg p.o. b.i.d. x10 days with food.  2.  Vicodin 1 p.o. q.6h. p.r.n. pain, #30.  3.  Cipro HC 3 drops left ear t.i.d. x7 days.  4.  Phenergan suppositories 25 mg q.6h. p.r.n. nausea.   He is to follow a regular diet, keep his head elevated, avoid aspirin and  aspirin products, and avoid water exposure to left ear for 3 weeks. His  parents will be given both verbal and written instructions.   SUMMARY OF FINDINGS:  The patient was found to have attic cholesteatoma in a  deep superior retraction pocket. The cholesteatoma was extending to an  eroded incus. The body of the incus was left in place. Mastoid was found to  be sclerotic with granulation tissue in the antrum. There was no  cholesteatoma found in the attic proper. There was some exposed dura at the  tegmen tympani. A type 3 myringostapediopexy was left. The facial nerve was covered by bone in the horizontal portion and was not exposed in the  vertical descending portion. The dome of the lateral canal was in normal  position.           ______________________________  Carolan Shiver, M.D.     EMK/MEDQ  D:  04/11/2005  T:  04/14/2005  Job:  [id number]   cc:   Carolan Shiver, M.D.  Fax: [phone]

## 2010-11-06 ENCOUNTER — Other Ambulatory Visit: Payer: Self-pay | Admitting: Oncology

## 2010-11-06 ENCOUNTER — Encounter (HOSPITAL_BASED_OUTPATIENT_CLINIC_OR_DEPARTMENT_OTHER): Payer: Medicare Other | Admitting: Oncology

## 2010-11-06 DIAGNOSIS — C6290 Malignant neoplasm of unspecified testis, unspecified whether descended or undescended: Secondary | ICD-10-CM

## 2010-11-06 DIAGNOSIS — D72819 Decreased white blood cell count, unspecified: Secondary | ICD-10-CM

## 2010-11-06 LAB — CBC WITH DIFFERENTIAL/PLATELET
Basophils Absolute: 0 10*3/uL (ref 0.0–0.1)
Eosinophils Absolute: 0.1 10*3/uL (ref 0.0–0.5)
HGB: 15.1 g/dL (ref 13.0–17.1)
LYMPH%: 31.7 % (ref 14.0–49.0)
MCV: 98.1 fL — ABNORMAL HIGH (ref 79.3–98.0)
MONO#: 0.3 10*3/uL (ref 0.1–0.9)
MONO%: 7.6 % (ref 0.0–14.0)
NEUT#: 2.4 10*3/uL (ref 1.5–6.5)
Platelets: 243 10*3/uL (ref 140–400)
RBC: 4.42 10*6/uL (ref 4.20–5.82)
WBC: 4.1 10*3/uL (ref 4.0–10.3)

## 2010-11-08 LAB — COMPREHENSIVE METABOLIC PANEL
Albumin: 3.9 g/dL (ref 3.5–5.2)
Alkaline Phosphatase: 104 U/L (ref 39–117)
BUN: 17 mg/dL (ref 6–23)
CO2: 26 mEq/L (ref 19–32)
Glucose, Bld: 111 mg/dL — ABNORMAL HIGH (ref 70–99)
Potassium: 4.3 mEq/L (ref 3.5–5.3)
Sodium: 140 mEq/L (ref 135–145)
Total Bilirubin: 0.4 mg/dL (ref 0.3–1.2)
Total Protein: 6.7 g/dL (ref 6.0–8.3)

## 2010-11-08 LAB — BETA HCG QUANT (REF LAB): Beta hCG, Tumor Marker: <0.5 m[IU]/mL (ref <5.0)

## 2010-11-08 LAB — AFP TUMOR MARKER: AFP-Tumor Marker: 3.3 ng/mL (ref 0.0–8.0)

## 2010-11-13 ENCOUNTER — Encounter (HOSPITAL_BASED_OUTPATIENT_CLINIC_OR_DEPARTMENT_OTHER): Payer: Medicare Other | Admitting: Oncology

## 2010-11-13 DIAGNOSIS — D72819 Decreased white blood cell count, unspecified: Secondary | ICD-10-CM

## 2010-11-13 DIAGNOSIS — C6290 Malignant neoplasm of unspecified testis, unspecified whether descended or undescended: Secondary | ICD-10-CM

## 2011-02-06 LAB — COMPREHENSIVE METABOLIC PANEL
ALT: 65 — ABNORMAL HIGH
AST: 31
Albumin: 3.2 — ABNORMAL LOW
Alkaline Phosphatase: 156 — ABNORMAL HIGH
CO2: 27
Chloride: 101
Creatinine, Ser: 0.9
GFR calc Af Amer: >60
GFR calc non Af Amer: >60
Potassium: 4
Total Bilirubin: 0.9

## 2011-02-06 LAB — CBC
HCT: 40.9
Hemoglobin: 14.1
MCHC: 34.6
MCV: 92.9
MCV: 93.9
Platelets: 536 — ABNORMAL HIGH
RBC: 4.35
RBC: 4.5
WBC: 55.7
WBC: 57.6

## 2011-04-18 ENCOUNTER — Telehealth: Payer: Self-pay | Admitting: *Deleted

## 2011-04-28 ENCOUNTER — Telehealth: Payer: Self-pay | Admitting: Oncology

## 2011-04-28 NOTE — Telephone Encounter (Signed)
lmonvm adviisng the pt of his chest x-ray appt as a walkin any day prior to the appt with dr Clelia Croft

## 2011-05-01 DIAGNOSIS — Z23 Encounter for immunization: Secondary | ICD-10-CM | POA: Diagnosis not present

## 2011-05-19 ENCOUNTER — Encounter: Payer: Self-pay | Admitting: *Deleted

## 2011-05-26 ENCOUNTER — Ambulatory Visit (HOSPITAL_COMMUNITY)
Admission: RE | Admit: 2011-05-26 | Discharge: 2011-05-26 | Disposition: A | Discharge status: Home / Self Care | Payer: Medicare Other | Source: Ambulatory Visit | Attending: Oncology | Admitting: Oncology

## 2011-05-26 ENCOUNTER — Telehealth: Payer: Self-pay | Admitting: *Deleted

## 2011-05-26 DIAGNOSIS — Z8547 Personal history of malignant neoplasm of testis: Secondary | ICD-10-CM | POA: Insufficient documentation

## 2011-05-26 DIAGNOSIS — C6290 Malignant neoplasm of unspecified testis, unspecified whether descended or undescended: Secondary | ICD-10-CM

## 2011-05-26 NOTE — Telephone Encounter (Signed)
Nedra Hai from Radiology called. Patient there for CXR withno orders.  Noted in [place] on P.O.F. Dated 11-13-2010 for pt. To have a PA/Lat cxr Jan. 29, 2013.  Will enter this order in EPIC.

## 2011-05-29 ENCOUNTER — Ambulatory Visit (HOSPITAL_BASED_OUTPATIENT_CLINIC_OR_DEPARTMENT_OTHER): Payer: Medicare Other | Admitting: Oncology

## 2011-05-29 ENCOUNTER — Other Ambulatory Visit (HOSPITAL_BASED_OUTPATIENT_CLINIC_OR_DEPARTMENT_OTHER): Payer: Medicare Other | Admitting: Lab

## 2011-05-29 ENCOUNTER — Telehealth: Payer: Self-pay | Admitting: Oncology

## 2011-05-29 VITALS — BP 106/58 | HR 55 | Temp 99.3°F | Ht 61.5 in | Wt 114.8 lb

## 2011-05-29 DIAGNOSIS — Z9221 Personal history of antineoplastic chemotherapy: Secondary | ICD-10-CM | POA: Diagnosis not present

## 2011-05-29 DIAGNOSIS — Z8547 Personal history of malignant neoplasm of testis: Secondary | ICD-10-CM

## 2011-05-29 DIAGNOSIS — C6290 Malignant neoplasm of unspecified testis, unspecified whether descended or undescended: Secondary | ICD-10-CM | POA: Diagnosis not present

## 2011-05-29 DIAGNOSIS — D72819 Decreased white blood cell count, unspecified: Secondary | ICD-10-CM

## 2011-05-29 LAB — CBC WITH DIFFERENTIAL/PLATELET
BASO%: 0.8 % (ref 0.0–2.0)
EOS%: 0.5 % (ref 0.0–7.0)
HCT: 45.5 % (ref 38.4–49.9)
LYMPH%: 29 % (ref 14.0–49.0)
MCH: 33.9 pg — ABNORMAL HIGH (ref 27.2–33.4)
MCHC: 34.6 g/dL (ref 32.0–36.0)
MCV: 97.8 fL (ref 79.3–98.0)
MONO#: 0.2 10*3/uL (ref 0.1–0.9)
MONO%: 6.6 % (ref 0.0–14.0)
NEUT%: 63.1 % (ref 39.0–75.0)
Platelets: 241 10*3/uL (ref 140–400)
RBC: 4.65 10*6/uL (ref 4.20–5.82)

## 2011-05-29 LAB — COMPREHENSIVE METABOLIC PANEL
ALT: 28 U/L (ref 0–53)
Alkaline Phosphatase: 118 U/L — ABNORMAL HIGH (ref 39–117)
CO2: 33 mEq/L — ABNORMAL HIGH (ref 19–32)
Creatinine, Ser: 0.91 mg/dL (ref 0.50–1.35)
Total Bilirubin: 0.4 mg/dL (ref 0.3–1.2)

## 2011-05-29 LAB — LACTATE DEHYDROGENASE: LDH: 184 U/L (ref 94–250)

## 2011-05-29 NOTE — Telephone Encounter (Signed)
6/6 appt made and printed  aom

## 2011-05-29 NOTE — Progress Notes (Signed)
Hematology and Oncology Follow Up Visit  JAVAUN DIMPERIO [id number] 07-23-1975 36 y.o. 05/29/2011 4:26 PM  CC: Excell Seltzer. Annabell Howells, M.D.  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   Prior Therapy: 1. He is status post left orchiectomy done in March 2008.  Pathology revealed nonseminomatous testis cancer, 50% embryonal, 20% yolk-sac tumor.  He had an elevated alpha-fetoprotein.  CT scan showed a borderline 1.5-cm periaortic lymph node.  2. Patient treated with systemic chemotherapy utilizing cisplatin and etoposide.  Therapy concluded in June 2008 with normalization of his tumor markers without any evidence of anything to suggest recurrent disease.  Now he is over 4 years out from conclusion of his therapy.   Interim History:  Justin Savage presents today for a followup visit.  A very pleasant 36 year old Down syndrome gentleman who is currently on active surveillance from his cancer standpoint.  He had not reported any major changes since the last time I saw him.  He has continued to be extremely active and predominantly his exercise is including running, and biking.  He had not reported any abdominal pain.  He did not report any shortness of breath.  Did not report really any major change in his clinical status or activity.  He has continued to have an excellent performance status, excellent appetite really without any changes or deterioration in his health.  Medications: I have reviewed the patient's current medications. Current outpatient prescriptions:glucosamine-chondroitin 500-400 MG tablet, Take 1 tablet by mouth 3 (three) times daily., Disp: , Rfl: ;  Probiotic Product (PRO-BIOTIC BLEND PO), Take by mouth., Disp: , Rfl: ;  Multiple Vitamin (MULTIVITAMINS PO), Take by mouth daily., Disp: , Rfl:   Allergies: No Known Allergies  Past Medical History, Surgical history, Social history, and  Family History were reviewed and updated.  Review of Systems: Constitutional:  Negative for fever, chills, night sweats, anorexia, weight loss, pain. Cardiovascular: no chest pain or dyspnea on exertion Respiratory: no cough, shortness of breath, or wheezing Neurological: no TIA or stroke symptoms Dermatological: negative ENT: negative Skin: Negative. Gastrointestinal: no abdominal pain, change in bowel habits, or black or bloody stools Genito-Urinary: no dysuria, trouble voiding, or hematuria Hematological and Lymphatic: negative Breast: negative Musculoskeletal: negative Remaining ROS negative. Physical Exam: Blood pressure 106/58, pulse 55, temperature 99.3 F (37.4 C), height 5' 1.5" (1.562 m), weight 114 lb 12.8 oz (52.073 kg). ECOG:  General appearance: alert Head: Normocephalic, without obvious abnormality, atraumatic Neck: no adenopathy, no carotid bruit, no JVD, supple, symmetrical, trachea midline and thyroid not enlarged, symmetric, no tenderness/mass/nodules Lymph nodes: Cervical, supraclavicular, and axillary nodes normal. Heart:regular rate and rhythm, S1, S2 normal, no murmur, click, rub or gallop Lung:chest clear, no wheezing, rales, normal symmetric air entry Abdomin: soft, non-tender, without masses or organomegaly EXT:no erythema, induration, or nodules   Lab Results: Lab Results  Component Value Date   WBC 3.3* 05/29/2011   HGB 15.7 05/29/2011   HCT 45.5 05/29/2011   MCV 97.8 05/29/2011   PLT 241 05/29/2011   Results for MARLOW, BERENGUER (MRN [id number]) as of 05/29/2011 16:27  Ref. Range 11/06/2010 13:46  Sodium Latest Range: 135-145 mEq/L 140  Potassium Latest Range: 3.5-5.3 mEq/L 4.3  Chloride Latest Range: 96-112 mEq/L 103  CO2 Latest Range: 19-32 mEq/L 26  BUN Latest Range: 6-23 mg/dL 17  Creat Latest Range: 0.50-1.35 mg/dL 7.82  Calcium Latest Range: 8.4-10.5 mg/dL 8.7  Glucose  Latest Range: 70-99 mg/dL 161 (H)  Alkaline Phosphatase Latest Range: 39-117  U/L 104  Albumin Latest Range: 3.5-5.2 g/dL 3.9  AST Latest Range: 0-37 U/L 28  ALT Latest Range: 0-53 U/L 24  Total Protein Latest Range: 6.0-8.3 g/dL 6.7  Total Bilirubin Latest Range: 0.3-1.2 mg/dL 0.4    Radiological Studies:  HEST - 2 VIEW 05/26/2011 Comparison: 05/09/2010  Findings: Lungs are clear. No pleural effusion or pneumothorax.  Cardiomediastinal silhouette is within normal limits.  Visualized osseous structures are within normal limits.  IMPRESSION:  No evidence of acute cardiopulmonary disease.    Impression and Plan: A 36 year old gentleman with the following issues:  1. Stage IIA nonseminomatous testis cancer.  Status post orchiectomy, followed by chemotherapy utilizing cisplatin and etoposide.  Therapy concluded in 2008 without any evidence to suggest recurrent disease.  Since the last time I saw him, he has not had anything to suggest relapse at this time, so will continue to have active surveillance and followup clinically with tumor markers every 6 months.  I will obtain a chest x-ray in January   2014.   2.     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  [facility], MD 2/7/20134:26 PM

## 2011-06-02 DIAGNOSIS — H25099 Other age-related incipient cataract, unspecified eye: Secondary | ICD-10-CM | POA: Diagnosis not present

## 2011-07-03 DIAGNOSIS — Z0000 Encounter for general adult medical examination without abnormal findings: Secondary | ICD-10-CM | POA: Diagnosis not present

## 2011-07-03 DIAGNOSIS — Z136 Encounter for screening for cardiovascular disorders: Secondary | ICD-10-CM | POA: Diagnosis not present

## 2011-07-03 DIAGNOSIS — Z1329 Encounter for screening for other suspected endocrine disorder: Secondary | ICD-10-CM | POA: Diagnosis not present

## 2011-07-03 DIAGNOSIS — Z23 Encounter for immunization: Secondary | ICD-10-CM | POA: Diagnosis not present

## 2011-07-03 DIAGNOSIS — C6290 Malignant neoplasm of unspecified testis, unspecified whether descended or undescended: Secondary | ICD-10-CM | POA: Diagnosis not present

## 2011-07-03 DIAGNOSIS — R143 Flatulence: Secondary | ICD-10-CM | POA: Diagnosis not present

## 2011-07-03 DIAGNOSIS — Q909 Down syndrome, unspecified: Secondary | ICD-10-CM | POA: Diagnosis not present

## 2011-07-03 DIAGNOSIS — Z131 Encounter for screening for diabetes mellitus: Secondary | ICD-10-CM | POA: Diagnosis not present

## 2011-07-03 DIAGNOSIS — H6120 Impacted cerumen, unspecified ear: Secondary | ICD-10-CM | POA: Diagnosis not present

## 2011-07-29 DIAGNOSIS — M2170 Unequal limb length (acquired), unspecified site: Secondary | ICD-10-CM | POA: Diagnosis not present

## 2011-09-17 ENCOUNTER — Telehealth: Payer: Self-pay | Admitting: Oncology

## 2011-09-17 NOTE — Telephone Encounter (Signed)
Moved 6/6 appt to 6/7 due to survivor day. lmonvm informing pt and mailed new schedule

## 2011-09-25 ENCOUNTER — Other Ambulatory Visit: Payer: Medicare Other | Admitting: Lab

## 2011-09-25 ENCOUNTER — Ambulatory Visit: Payer: Medicare Other | Admitting: Oncology

## 2011-09-26 ENCOUNTER — Telehealth: Payer: Self-pay | Admitting: Oncology

## 2011-09-26 ENCOUNTER — Ambulatory Visit (HOSPITAL_BASED_OUTPATIENT_CLINIC_OR_DEPARTMENT_OTHER): Payer: Medicare Other | Admitting: Oncology

## 2011-09-26 ENCOUNTER — Other Ambulatory Visit (HOSPITAL_BASED_OUTPATIENT_CLINIC_OR_DEPARTMENT_OTHER): Payer: Medicare Other | Admitting: Lab

## 2011-09-26 VITALS — BP 104/57 | HR 58 | Temp 97.1°F | Ht 61.5 in | Wt 115.2 lb

## 2011-09-26 DIAGNOSIS — T451X5A Adverse effect of antineoplastic and immunosuppressive drugs, initial encounter: Secondary | ICD-10-CM | POA: Diagnosis not present

## 2011-09-26 DIAGNOSIS — D72819 Decreased white blood cell count, unspecified: Secondary | ICD-10-CM | POA: Diagnosis not present

## 2011-09-26 DIAGNOSIS — C6290 Malignant neoplasm of unspecified testis, unspecified whether descended or undescended: Secondary | ICD-10-CM | POA: Diagnosis not present

## 2011-09-26 LAB — CBC WITH DIFFERENTIAL/PLATELET
Eosinophils Absolute: 0 10*3/uL (ref 0.0–0.5)
HGB: 16.2 g/dL (ref 13.0–17.1)
LYMPH%: 30.6 % (ref 14.0–49.0)
MONO#: 0.2 10*3/uL (ref 0.1–0.9)
NEUT#: 1.8 10*3/uL (ref 1.5–6.5)
Platelets: 246 10*3/uL (ref 140–400)
RBC: 4.75 10*6/uL (ref 4.20–5.82)
RDW: 13.2 % (ref 11.0–14.6)
WBC: 3.1 10*3/uL — ABNORMAL LOW (ref 4.0–10.3)

## 2011-09-26 NOTE — Telephone Encounter (Signed)
appts made and printed for pt,aware to get cxr either prior to or after lab  aom

## 2011-09-26 NOTE — Progress Notes (Signed)
Hematology and Oncology Follow Up Visit  SMITTY ACKERLEY [id number] 16-Mar-1976 36 y.o. 09/26/2011 12:28 PM  CC: Excell Seltzer. Annabell Howells, M.D.  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   Prior Therapy: 1. He is status post left orchiectomy done in March 2008.  Pathology revealed nonseminomatous testis cancer, 50% embryonal, 20% yolk-sac tumor.  He had an elevated alpha-fetoprotein.  CT scan showed a borderline 1.5-cm periaortic lymph node.  2. Patient treated with systemic chemotherapy utilizing cisplatin and etoposide.  Therapy concluded in June 2008 with normalization of his tumor markers without any evidence of anything to suggest recurrent disease.  Now he is over 4 years out from conclusion of his therapy.   Interim History:  Emaad presents today for a followup visit.  A very pleasant 36 year old Down syndrome gentleman who is currently on active surveillance from his cancer standpoint.  He had not reported any major changes since the last time I saw him.  He has continued to be extremely active and predominantly his exercise is including running, and biking.  He had not reported any abdominal pain.  He did not report any shortness of breath.  Did not report really any major change in his clinical status or activity.  He has continued to have an excellent performance status, excellent appetite really without any changes or deterioration in his health.  Medications: I have reviewed the patient's current medications. Current outpatient prescriptions:glucosamine-chondroitin 500-400 MG tablet, Take 1 tablet by mouth 3 (three) times daily., Disp: , Rfl: ;  Multiple Vitamin (MULTIVITAMINS PO), Take by mouth daily., Disp: , Rfl: ;  Probiotic Product (PRO-BIOTIC BLEND PO), Take by mouth., Disp: , Rfl:   Allergies: No Known Allergies  Past Medical History, Surgical history, Social history, and  Family History were reviewed and updated.  Review of Systems: Constitutional:  Negative for fever, chills, night sweats, anorexia, weight loss, pain. Cardiovascular: no chest pain or dyspnea on exertion Respiratory: no cough, shortness of breath, or wheezing Neurological: no TIA or stroke symptoms Dermatological: negative ENT: negative Skin: Negative. Gastrointestinal: no abdominal pain, change in bowel habits, or black or bloody stools Genito-Urinary: no dysuria, trouble voiding, or hematuria Hematological and Lymphatic: negative Breast: negative Musculoskeletal: negative Remaining ROS negative. Physical Exam: Blood pressure 104/57, pulse 58, temperature 97.1 F (36.2 C), temperature source Oral, height 5' 1.5" (1.562 m), weight 115 lb 3.2 oz (52.254 kg). ECOG: 0 General appearance: alert Head: Normocephalic, without obvious abnormality, atraumatic Neck: no adenopathy, no carotid bruit, no JVD, supple, symmetrical, trachea midline and thyroid not enlarged, symmetric, no tenderness/mass/nodules Lymph nodes: Cervical, supraclavicular, and axillary nodes normal. Heart:regular rate and rhythm, S1, S2 normal, no murmur, click, rub or gallop Lung:chest clear, no wheezing, rales, normal symmetric air entry Abdomin: soft, non-tender, without masses or organomegaly EXT:no erythema, induration, or nodules   Lab Results: Lab Results  Component Value Date   WBC 3.1* 09/26/2011   HGB 16.2 09/26/2011   HCT 46.5 09/26/2011   MCV 98.1* 09/26/2011   PLT 246 09/26/2011      Impression and Plan: A 36 year old gentleman with the following issues:  1. Stage IIA nonseminomatous testis cancer.  Status post orchiectomy, followed by chemotherapy utilizing cisplatin and etoposide.  Therapy concluded in 2008 without any evidence to suggest recurrent disease.  Since the last time I saw him, he has not had anything to suggest relapse at this time, so will  continue to have active surveillance and followup  clinically with tumor markers every 6 months.  I will obtain a chest x-ray in January   2014.   2.     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  [facility], MD 6/7/201312:28 PM

## 2011-09-30 LAB — COMPREHENSIVE METABOLIC PANEL
Albumin: 4 g/dL (ref 3.5–5.2)
CO2: 27 mEq/L (ref 19–32)
Glucose, Bld: 87 mg/dL (ref 70–99)
Potassium: 4.4 mEq/L (ref 3.5–5.3)
Sodium: 139 mEq/L (ref 135–145)
Total Protein: 6.5 g/dL (ref 6.0–8.3)

## 2011-09-30 LAB — AFP TUMOR MARKER: AFP-Tumor Marker: 3.7 ng/mL (ref 0.0–8.0)

## 2011-09-30 LAB — LACTATE DEHYDROGENASE: LDH: 137 U/L (ref 94–250)

## 2012-01-09 DIAGNOSIS — M25569 Pain in unspecified knee: Secondary | ICD-10-CM | POA: Diagnosis not present

## 2012-02-03 DIAGNOSIS — Z23 Encounter for immunization: Secondary | ICD-10-CM | POA: Diagnosis not present

## 2012-04-23 ENCOUNTER — Ambulatory Visit (HOSPITAL_COMMUNITY)
Admission: RE | Admit: 2012-04-23 | Discharge: 2012-04-23 | Disposition: A | Discharge status: Home / Self Care | Payer: Medicare Other | Source: Ambulatory Visit | Attending: Oncology | Admitting: Oncology

## 2012-04-23 DIAGNOSIS — R42 Dizziness and giddiness: Secondary | ICD-10-CM | POA: Insufficient documentation

## 2012-04-23 DIAGNOSIS — R05 Cough: Secondary | ICD-10-CM | POA: Insufficient documentation

## 2012-04-23 DIAGNOSIS — Z8547 Personal history of malignant neoplasm of testis: Secondary | ICD-10-CM | POA: Insufficient documentation

## 2012-04-23 DIAGNOSIS — C6290 Malignant neoplasm of unspecified testis, unspecified whether descended or undescended: Secondary | ICD-10-CM

## 2012-04-23 DIAGNOSIS — R059 Cough, unspecified: Secondary | ICD-10-CM | POA: Diagnosis not present

## 2012-04-27 ENCOUNTER — Other Ambulatory Visit: Payer: Medicare Other | Admitting: Lab

## 2012-04-30 ENCOUNTER — Ambulatory Visit (HOSPITAL_BASED_OUTPATIENT_CLINIC_OR_DEPARTMENT_OTHER): Payer: Medicare Other | Admitting: Oncology

## 2012-04-30 ENCOUNTER — Telehealth: Payer: Self-pay | Admitting: Oncology

## 2012-04-30 ENCOUNTER — Other Ambulatory Visit (HOSPITAL_BASED_OUTPATIENT_CLINIC_OR_DEPARTMENT_OTHER): Payer: Medicare Other

## 2012-04-30 VITALS — BP 109/72 | HR 63 | Temp 97.2°F | Resp 18 | Ht 61.5 in | Wt 120.0 lb

## 2012-04-30 DIAGNOSIS — D72819 Decreased white blood cell count, unspecified: Secondary | ICD-10-CM | POA: Diagnosis not present

## 2012-04-30 DIAGNOSIS — Z8547 Personal history of malignant neoplasm of testis: Secondary | ICD-10-CM | POA: Diagnosis not present

## 2012-04-30 DIAGNOSIS — C6290 Malignant neoplasm of unspecified testis, unspecified whether descended or undescended: Secondary | ICD-10-CM

## 2012-04-30 LAB — COMPREHENSIVE METABOLIC PANEL (CC13)
AST: 27 U/L (ref 5–34)
Albumin: 3.3 g/dL — ABNORMAL LOW (ref 3.5–5.0)
Alkaline Phosphatase: 110 U/L (ref 40–150)
BUN: 15 mg/dL (ref 7.0–26.0)
Potassium: 4.4 mEq/L (ref 3.5–5.1)
Sodium: 143 mEq/L (ref 136–145)

## 2012-04-30 LAB — CBC WITH DIFFERENTIAL/PLATELET
Basophils Absolute: 0 10*3/uL (ref 0.0–0.1)
EOS%: 1.4 % (ref 0.0–7.0)
MCH: 33.7 pg — ABNORMAL HIGH (ref 27.2–33.4)
MCV: 95.6 fL (ref 79.3–98.0)
MONO%: 10.1 % (ref 0.0–14.0)
RBC: 4.8 10*6/uL (ref 4.20–5.82)
RDW: 13.2 % (ref 11.0–14.6)

## 2012-04-30 NOTE — Telephone Encounter (Signed)
Gave pt appts for Jan 2015.

## 2012-04-30 NOTE — Progress Notes (Signed)
Hematology and Oncology Follow Up Visit  Justin Savage [id number] 18-Jul-1975 36 y.o. 04/30/2012 1:54 PM  CC: Justin Savage. Justin Savage, M.D.  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   Prior Therapy: 1. He is status post left orchiectomy done in March 2008.  Pathology revealed nonseminomatous testis cancer, 50% embryonal, 20% yolk-sac tumor.  He had an elevated alpha-fetoprotein.  CT scan showed a borderline 1.5-cm periaortic lymph node.  2. Patient treated with systemic chemotherapy utilizing cisplatin and etoposide.  Therapy concluded in June 2008 with normalization of his tumor markers without any evidence of anything to suggest recurrent disease.  Now he is over 5 years out from conclusion of his therapy.   Interim History:  Justin Savage presents today for a followup visit.  A very pleasant 37 year old Down syndrome gentleman who is currently on active surveillance from his cancer standpoint.  He had not reported any major changes since the last time I saw him.  He has continued to be extremely active and predominantly his exercise is including walking only.  He had not reported any abdominal pain.  He did not report any shortness of breath.  Did not report really any major change in his clinical status or activity.  He has continued to have an excellent performance status, excellent appetite really without any changes or deterioration in his health. He has reported knee pain due to arthritis.   Medications: I have reviewed the patient's current medications. Current outpatient prescriptions:glucosamine-chondroitin 500-400 MG tablet, Take 1 tablet by mouth daily., Disp: , Rfl: ;  Multiple Vitamin (MULTIVITAMINS PO), Take by mouth daily., Disp: , Rfl:   Allergies: No Known Allergies  Past Medical History, Surgical history, Social history, and Family History were reviewed and updated.  Review  of Systems: Constitutional:  Negative for fever, chills, night sweats, anorexia, weight loss, pain. Cardiovascular: no chest pain or dyspnea on exertion Respiratory: no cough, shortness of breath, or wheezing Neurological: no TIA or stroke symptoms Dermatological: negative ENT: negative Skin: Negative. Gastrointestinal: no abdominal pain, change in bowel habits, or black or bloody stools Genito-Urinary: no dysuria, trouble voiding, or hematuria Hematological and Lymphatic: negative Breast: negative Musculoskeletal: negative Remaining ROS negative. Physical Exam: Blood pressure 109/72, pulse 63, temperature 97.2 F (36.2 C), temperature source Oral, resp. rate 18, height 5' 1.5" (1.562 m), weight 120 lb (54.432 kg). ECOG: 0 General appearance: alert Head: Normocephalic, without obvious abnormality, atraumatic Neck: no adenopathy, no carotid bruit, no JVD, supple, symmetrical, trachea midline and thyroid not enlarged, symmetric, no tenderness/mass/nodules Lymph nodes: Cervical, supraclavicular, and axillary nodes normal. Heart:regular rate and rhythm, S1, S2 normal, no murmur, click, rub or gallop Lung:chest clear, no wheezing, rales, normal symmetric air entry Abdomin: soft, non-tender, without masses or organomegaly EXT:no erythema, induration, or nodules   Lab Results: Lab Results  Component Value Date   WBC 3.1* 09/26/2011   HGB 16.2 09/26/2011   HCT 46.5 09/26/2011   MCV 98.1* 09/26/2011   PLT 246 09/26/2011      Impression and Plan: A 36 year old gentleman with the following issues:  1. Stage IIA nonseminomatous testis cancer.  Status post orchiectomy, followed by chemotherapy utilizing cisplatin and etoposide.  Therapy concluded in 2008 without any evidence to suggest recurrent disease.  Since the last time I saw him, he has not had anything to suggest relapse at this time. Chest xray reviewed today and showed no active disease.  The plan is to continue to have active surveillance  and followup clinically with tumor markers every 12 months.  I will obtain a chest x-ray in January 2015.   2.     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  Justin Hose, MD 1/10/20141:54 PM

## 2012-05-03 LAB — BETA HCG QUANT (REF LAB): Beta hCG, Tumor Marker: <0.5 m[IU]/mL (ref <5.0)

## 2012-05-04 DIAGNOSIS — H905 Unspecified sensorineural hearing loss: Secondary | ICD-10-CM | POA: Diagnosis not present

## 2012-05-04 DIAGNOSIS — Q909 Down syndrome, unspecified: Secondary | ICD-10-CM | POA: Diagnosis not present

## 2012-05-04 DIAGNOSIS — H6120 Impacted cerumen, unspecified ear: Secondary | ICD-10-CM | POA: Diagnosis not present

## 2012-06-15 DIAGNOSIS — H04129 Dry eye syndrome of unspecified lacrimal gland: Secondary | ICD-10-CM | POA: Diagnosis not present

## 2012-08-10 DIAGNOSIS — Z0000 Encounter for general adult medical examination without abnormal findings: Secondary | ICD-10-CM | POA: Diagnosis not present

## 2013-05-03 ENCOUNTER — Other Ambulatory Visit: Payer: Self-pay | Admitting: Oncology

## 2013-05-03 DIAGNOSIS — C6290 Malignant neoplasm of unspecified testis, unspecified whether descended or undescended: Secondary | ICD-10-CM

## 2013-05-04 ENCOUNTER — Other Ambulatory Visit: Payer: Medicare Other

## 2013-05-06 ENCOUNTER — Ambulatory Visit: Payer: Medicare Other | Admitting: Oncology

## 2013-05-10 DIAGNOSIS — M25569 Pain in unspecified knee: Secondary | ICD-10-CM | POA: Diagnosis not present

## 2013-05-24 DIAGNOSIS — Q909 Down syndrome, unspecified: Secondary | ICD-10-CM | POA: Diagnosis not present

## 2013-05-24 DIAGNOSIS — H6120 Impacted cerumen, unspecified ear: Secondary | ICD-10-CM | POA: Diagnosis not present

## 2013-05-24 DIAGNOSIS — H905 Unspecified sensorineural hearing loss: Secondary | ICD-10-CM | POA: Diagnosis not present

## 2013-06-15 DIAGNOSIS — H25039 Anterior subcapsular polar age-related cataract, unspecified eye: Secondary | ICD-10-CM | POA: Diagnosis not present

## 2013-06-15 DIAGNOSIS — H52229 Regular astigmatism, unspecified eye: Secondary | ICD-10-CM | POA: Diagnosis not present

## 2013-06-15 DIAGNOSIS — H2589 Other age-related cataract: Secondary | ICD-10-CM | POA: Diagnosis not present

## 2013-06-15 DIAGNOSIS — H5210 Myopia, unspecified eye: Secondary | ICD-10-CM | POA: Diagnosis not present

## 2013-07-01 ENCOUNTER — Telehealth: Payer: Self-pay | Admitting: Oncology

## 2013-07-01 NOTE — Telephone Encounter (Signed)
pt came in to r/s missed appt...done...gv and printed new March sched

## 2013-07-13 ENCOUNTER — Other Ambulatory Visit (HOSPITAL_BASED_OUTPATIENT_CLINIC_OR_DEPARTMENT_OTHER): Payer: Medicare Other

## 2013-07-13 DIAGNOSIS — C6290 Malignant neoplasm of unspecified testis, unspecified whether descended or undescended: Secondary | ICD-10-CM

## 2013-07-13 DIAGNOSIS — Z8547 Personal history of malignant neoplasm of testis: Secondary | ICD-10-CM

## 2013-07-13 LAB — CBC WITH DIFFERENTIAL/PLATELET
BASO%: 1.6 % (ref 0.0–2.0)
BASOS ABS: 0.1 10*3/uL (ref 0.0–0.1)
EOS%: 1.7 % (ref 0.0–7.0)
Eosinophils Absolute: 0.1 10*3/uL (ref 0.0–0.5)
HCT: 47.6 % (ref 38.4–49.9)
HGB: 16.1 g/dL (ref 13.0–17.1)
LYMPH%: 28.5 % (ref 14.0–49.0)
MCH: 32.9 pg (ref 27.2–33.4)
MCHC: 33.8 g/dL (ref 32.0–36.0)
MCV: 97.3 fL (ref 79.3–98.0)
MONO#: 0.3 10*3/uL (ref 0.1–0.9)
MONO%: 8.5 % (ref 0.0–14.0)
NEUT#: 2.1 10*3/uL (ref 1.5–6.5)
NEUT%: 59.7 % (ref 39.0–75.0)
Platelets: 268 10*3/uL (ref 140–400)
RBC: 4.89 10*6/uL (ref 4.20–5.82)
RDW: 14 % (ref 11.0–14.6)
WBC: 3.5 10*3/uL — ABNORMAL LOW (ref 4.0–10.3)
lymph#: 1 10*3/uL (ref 0.9–3.3)

## 2013-07-13 LAB — COMPREHENSIVE METABOLIC PANEL (CC13)
ALK PHOS: 97 U/L (ref 40–150)
ALT: 26 U/L (ref 0–55)
AST: 28 U/L (ref 5–34)
Albumin: 3.9 g/dL (ref 3.5–5.0)
Anion Gap: 11 mEq/L (ref 3–11)
BUN: 15.7 mg/dL (ref 7.0–26.0)
CO2: 26 mEq/L (ref 22–29)
CREATININE: 0.9 mg/dL (ref 0.7–1.3)
Calcium: 9.7 mg/dL (ref 8.4–10.4)
Chloride: 106 mEq/L (ref 98–109)
Glucose: 100 mg/dl (ref 70–140)
POTASSIUM: 4.4 meq/L (ref 3.5–5.1)
Sodium: 144 mEq/L (ref 136–145)
Total Bilirubin: 0.68 mg/dL (ref 0.20–1.20)
Total Protein: 7.1 g/dL (ref 6.4–8.3)

## 2013-07-13 LAB — LACTATE DEHYDROGENASE (CC13): LDH: 178 U/L (ref 125–245)

## 2013-07-15 ENCOUNTER — Ambulatory Visit (HOSPITAL_BASED_OUTPATIENT_CLINIC_OR_DEPARTMENT_OTHER): Payer: Medicare Other | Admitting: Oncology

## 2013-07-15 ENCOUNTER — Encounter: Payer: Self-pay | Admitting: Oncology

## 2013-07-15 VITALS — BP 129/81 | HR 62 | Temp 95.0°F | Resp 18 | Wt 123.0 lb

## 2013-07-15 DIAGNOSIS — C6290 Malignant neoplasm of unspecified testis, unspecified whether descended or undescended: Secondary | ICD-10-CM | POA: Insufficient documentation

## 2013-07-15 DIAGNOSIS — Z8547 Personal history of malignant neoplasm of testis: Secondary | ICD-10-CM

## 2013-07-15 DIAGNOSIS — D72819 Decreased white blood cell count, unspecified: Secondary | ICD-10-CM

## 2013-07-15 LAB — BETA HCG QUANT (REF LAB): Beta hCG, Tumor Marker: <2 m[IU]/mL (ref <5.0)

## 2013-07-15 LAB — AFP TUMOR MARKER: AFP TUMOR MARKER: 2.9 ng/mL (ref 0.0–8.0)

## 2013-07-15 NOTE — Progress Notes (Signed)
Hematology and Oncology Follow Up Visit  Justin Savage [id number] Sep 03, 1975 38 y.o. 07/15/2013 8:52 AM  CC: Justin Savage. Justin Savage, M.D.  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   Prior Therapy: 1. He is status post left orchiectomy done in March 2008.  Pathology revealed nonseminomatous testis cancer, 50% embryonal, 20% yolk-sac tumor.  He had an elevated alpha-fetoprotein.  CT scan showed a borderline 1.5-cm periaortic lymph node.  2. Patient treated with systemic chemotherapy utilizing cisplatin and etoposide.  Therapy concluded in June 2008 with normalization of his tumor markers without any evidence of anything to suggest recurrent disease.  Now he is over 5 years out from conclusion of his therapy.   Interim History:  Justin Savage presents today for a followup visit.  A very pleasant 38 year old Down syndrome gentleman who is currently on active surveillance from his cancer standpoint.  He had not reported any major changes since the last time I saw him.  He has continued to be extremely active and predominantly his exercise is including walking and basketball and occasional swimming  He had not reported any abdominal pain.  He did not report any shortness of breath.  Did not report really any major change in his clinical status or activity.  He has continued to have an excellent performance status, excellent appetite really without any changes or deterioration in his health.   Medications: I have reviewed the patient's current medications.   Current Outpatient Prescriptions  Medication Sig Dispense Refill  . glucosamine-chondroitin 500-400 MG tablet Take 1 tablet by mouth daily.      . Multiple Vitamin (MULTIVITAMINS PO) Take by mouth daily.       No current facility-administered medications for this visit.    Allergies: No Known Allergies  Past Medical History, Surgical  history, Social history, and Family History were reviewed and updated.  Review of Systems: Constitutional:  Negative for fever, chills, night sweats, anorexia, weight loss, pain. Cardiovascular: no chest pain or dyspnea on exertion Respiratory: no cough, shortness of breath, or wheezing Neurological: no TIA or stroke symptoms Dermatological: negative ENT: negative Skin: Negative. Gastrointestinal: no abdominal pain, change in bowel habits, or black or bloody stools Genito-Urinary: no dysuria, trouble voiding, or hematuria Hematological and Lymphatic: negative Breast: negative Musculoskeletal: negative Remaining ROS negative. Physical Exam: Blood pressure 129/81, pulse 62, temperature 95 F (35 C), temperature source Oral, resp. rate 18, weight 123 lb (55.792 kg). ECOG: 0 General appearance: alert Head: Normocephalic, without obvious abnormality, atraumatic Neck: no adenopathy, no carotid bruit, no JVD, supple, symmetrical, trachea midline and thyroid not enlarged, symmetric, no tenderness/mass/nodules Lymph nodes: Cervical, supraclavicular, and axillary nodes normal. Heart:regular rate and rhythm, S1, S2 normal, no murmur, click, rub or gallop Lung:chest clear, no wheezing, rales, normal symmetric air entry Abdomin: soft, non-tender, without masses or organomegaly EXT:no erythema, induration, or nodules   Lab Results: Lab Results  Component Value Date   WBC 3.5* 07/13/2013   HGB 16.1 07/13/2013   HCT 47.6 07/13/2013   MCV 97.3 07/13/2013   PLT 268 07/13/2013   Results for Justin Savage (MRN [id number]) as of 07/15/2013 08:36  Ref. Range 04/30/2012 14:51 07/13/2013 09:11  AFP-Tumor Marker Latest Range: 0.0-8.0 ng/mL 3.1 2.9  Beta hCG, Tumor Marker Latest Range: < 5.0 mIU/mL < 0.5      Impression and Plan: A 38 year old gentleman with the following issues:  1. Stage IIA  nonseminomatous testis cancer.  Status post orchiectomy, followed by chemotherapy utilizing cisplatin and  etoposide.  Therapy concluded in 2008 without any evidence to suggest recurrent disease.  Since the last time I saw him, he has not had anything to suggest relapse at this time. At this time he is close to 7 years out from the conclusion of his chemotherapy. I given the option of returning here next year versus as needed. For the time being, we will see him in 12 months certainly sooner if needed to. 2.     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  [facility], MD 3/27/20158:52 AM

## 2013-07-19 ENCOUNTER — Telehealth: Payer: Self-pay | Admitting: Oncology

## 2013-07-19 NOTE — Telephone Encounter (Signed)
s.w. pt mother and advised on March 2016 appt....ok and aware

## 2014-01-31 DIAGNOSIS — H6123 Impacted cerumen, bilateral: Secondary | ICD-10-CM | POA: Diagnosis not present

## 2014-01-31 DIAGNOSIS — H74392 Other acquired abnormalities of left ear ossicles: Secondary | ICD-10-CM | POA: Diagnosis not present

## 2014-01-31 DIAGNOSIS — H9072 Mixed conductive and sensorineural hearing loss, unilateral, left ear, with unrestricted hearing on the contralateral side: Secondary | ICD-10-CM | POA: Diagnosis not present

## 2014-01-31 DIAGNOSIS — Q909 Down syndrome, unspecified: Secondary | ICD-10-CM | POA: Diagnosis not present

## 2014-07-07 ENCOUNTER — Other Ambulatory Visit: Payer: Medicare Other

## 2014-07-14 ENCOUNTER — Telehealth: Payer: Self-pay | Admitting: *Deleted

## 2014-07-14 ENCOUNTER — Ambulatory Visit: Payer: Medicare Other | Admitting: Oncology

## 2014-07-14 NOTE — Telephone Encounter (Signed)
Called and spoke with patient's mother. She forgot about the appt today and will call scheduling to re-schedule missed appt.

## 2014-08-09 DIAGNOSIS — M419 Scoliosis, unspecified: Secondary | ICD-10-CM | POA: Diagnosis not present

## 2014-08-09 DIAGNOSIS — Z79899 Other long term (current) drug therapy: Secondary | ICD-10-CM | POA: Diagnosis not present

## 2014-08-09 DIAGNOSIS — E559 Vitamin D deficiency, unspecified: Secondary | ICD-10-CM | POA: Diagnosis not present

## 2014-08-09 DIAGNOSIS — M129 Arthropathy, unspecified: Secondary | ICD-10-CM | POA: Diagnosis not present

## 2014-08-09 DIAGNOSIS — D72819 Decreased white blood cell count, unspecified: Secondary | ICD-10-CM | POA: Diagnosis not present

## 2014-08-09 DIAGNOSIS — H900 Conductive hearing loss, bilateral: Secondary | ICD-10-CM | POA: Diagnosis not present

## 2014-08-09 DIAGNOSIS — Z8547 Personal history of malignant neoplasm of testis: Secondary | ICD-10-CM | POA: Diagnosis not present

## 2014-08-09 DIAGNOSIS — E875 Hyperkalemia: Secondary | ICD-10-CM | POA: Diagnosis not present

## 2014-08-09 DIAGNOSIS — Q909 Down syndrome, unspecified: Secondary | ICD-10-CM | POA: Diagnosis not present

## 2014-08-09 DIAGNOSIS — Z136 Encounter for screening for cardiovascular disorders: Secondary | ICD-10-CM | POA: Diagnosis not present

## 2014-08-09 DIAGNOSIS — M2170 Unequal limb length (acquired), unspecified site: Secondary | ICD-10-CM | POA: Diagnosis not present

## 2014-08-09 DIAGNOSIS — R634 Abnormal weight loss: Secondary | ICD-10-CM | POA: Diagnosis not present

## 2014-08-09 DIAGNOSIS — Z0000 Encounter for general adult medical examination without abnormal findings: Secondary | ICD-10-CM | POA: Diagnosis not present

## 2014-09-19 DIAGNOSIS — M25561 Pain in right knee: Secondary | ICD-10-CM | POA: Diagnosis not present

## 2014-10-11 DIAGNOSIS — Q909 Down syndrome, unspecified: Secondary | ICD-10-CM | POA: Diagnosis not present

## 2014-10-11 DIAGNOSIS — H74392 Other acquired abnormalities of left ear ossicles: Secondary | ICD-10-CM | POA: Diagnosis not present

## 2014-10-11 DIAGNOSIS — H7112 Cholesteatoma of tympanum, left ear: Secondary | ICD-10-CM | POA: Diagnosis not present

## 2014-10-11 DIAGNOSIS — H9072 Mixed conductive and sensorineural hearing loss, unilateral, left ear, with unrestricted hearing on the contralateral side: Secondary | ICD-10-CM | POA: Diagnosis not present

## 2014-10-11 DIAGNOSIS — H6123 Impacted cerumen, bilateral: Secondary | ICD-10-CM | POA: Diagnosis not present

## 2014-11-28 DIAGNOSIS — H5213 Myopia, bilateral: Secondary | ICD-10-CM | POA: Diagnosis not present

## 2014-11-28 DIAGNOSIS — H25033 Anterior subcapsular polar age-related cataract, bilateral: Secondary | ICD-10-CM | POA: Diagnosis not present

## 2014-11-28 DIAGNOSIS — H52223 Regular astigmatism, bilateral: Secondary | ICD-10-CM | POA: Diagnosis not present

## 2014-12-04 DIAGNOSIS — H6123 Impacted cerumen, bilateral: Secondary | ICD-10-CM | POA: Diagnosis not present

## 2014-12-04 DIAGNOSIS — H74392 Other acquired abnormalities of left ear ossicles: Secondary | ICD-10-CM | POA: Diagnosis not present

## 2014-12-04 DIAGNOSIS — H7112 Cholesteatoma of tympanum, left ear: Secondary | ICD-10-CM | POA: Diagnosis not present

## 2014-12-04 DIAGNOSIS — Q909 Down syndrome, unspecified: Secondary | ICD-10-CM | POA: Diagnosis not present

## 2014-12-04 DIAGNOSIS — H9072 Mixed conductive and sensorineural hearing loss, unilateral, left ear, with unrestricted hearing on the contralateral side: Secondary | ICD-10-CM | POA: Diagnosis not present

## 2015-02-13 DIAGNOSIS — Z23 Encounter for immunization: Secondary | ICD-10-CM | POA: Diagnosis not present

## 2015-03-13 DIAGNOSIS — H7112 Cholesteatoma of tympanum, left ear: Secondary | ICD-10-CM | POA: Diagnosis not present

## 2015-03-13 DIAGNOSIS — Q909 Down syndrome, unspecified: Secondary | ICD-10-CM | POA: Diagnosis not present

## 2015-03-13 DIAGNOSIS — H9072 Mixed conductive and sensorineural hearing loss, unilateral, left ear, with unrestricted hearing on the contralateral side: Secondary | ICD-10-CM | POA: Diagnosis not present

## 2015-03-13 DIAGNOSIS — H6123 Impacted cerumen, bilateral: Secondary | ICD-10-CM | POA: Diagnosis not present

## 2015-03-13 DIAGNOSIS — H74392 Other acquired abnormalities of left ear ossicles: Secondary | ICD-10-CM | POA: Diagnosis not present

## 2015-04-12 DIAGNOSIS — H74392 Other acquired abnormalities of left ear ossicles: Secondary | ICD-10-CM | POA: Diagnosis not present

## 2015-04-12 DIAGNOSIS — H6123 Impacted cerumen, bilateral: Secondary | ICD-10-CM | POA: Diagnosis not present

## 2015-04-12 DIAGNOSIS — Q909 Down syndrome, unspecified: Secondary | ICD-10-CM | POA: Diagnosis not present

## 2015-04-12 DIAGNOSIS — H9072 Mixed conductive and sensorineural hearing loss, unilateral, left ear, with unrestricted hearing on the contralateral side: Secondary | ICD-10-CM | POA: Diagnosis not present

## 2015-04-12 DIAGNOSIS — H7413 Adhesive middle ear disease, bilateral: Secondary | ICD-10-CM | POA: Diagnosis not present

## 2015-12-05 DIAGNOSIS — H74392 Other acquired abnormalities of left ear ossicles: Secondary | ICD-10-CM | POA: Diagnosis not present

## 2015-12-05 DIAGNOSIS — Q909 Down syndrome, unspecified: Secondary | ICD-10-CM | POA: Diagnosis not present

## 2015-12-05 DIAGNOSIS — H6123 Impacted cerumen, bilateral: Secondary | ICD-10-CM | POA: Diagnosis not present

## 2015-12-05 DIAGNOSIS — H9072 Mixed conductive and sensorineural hearing loss, unilateral, left ear, with unrestricted hearing on the contralateral side: Secondary | ICD-10-CM | POA: Diagnosis not present

## 2015-12-05 DIAGNOSIS — H7413 Adhesive middle ear disease, bilateral: Secondary | ICD-10-CM | POA: Diagnosis not present

## 2015-12-17 DIAGNOSIS — H5213 Myopia, bilateral: Secondary | ICD-10-CM | POA: Diagnosis not present

## 2015-12-17 DIAGNOSIS — H25033 Anterior subcapsular polar age-related cataract, bilateral: Secondary | ICD-10-CM | POA: Diagnosis not present

## 2015-12-17 DIAGNOSIS — H52223 Regular astigmatism, bilateral: Secondary | ICD-10-CM | POA: Diagnosis not present

## 2016-01-18 DIAGNOSIS — Z23 Encounter for immunization: Secondary | ICD-10-CM | POA: Diagnosis not present

## 2016-05-13 ENCOUNTER — Telehealth: Payer: Self-pay | Admitting: *Deleted

## 2016-05-13 NOTE — Telephone Encounter (Signed)
Please send a message to scheduling. No labs just MD in the next few weeks is fine.

## 2016-05-13 NOTE — Telephone Encounter (Signed)
Mother called and left a voice message stating,"I need to schedule a yearly appointment with Dr. Alen Blew for my son Justin Savage who has Downs Syndrome. My return number is [phone].

## 2016-05-14 ENCOUNTER — Telehealth: Payer: Self-pay | Admitting: Oncology

## 2016-05-14 NOTE — Telephone Encounter (Signed)
sw pt mom to confirm 2/16 appt at 4 pm per LOS

## 2016-06-06 ENCOUNTER — Ambulatory Visit (HOSPITAL_BASED_OUTPATIENT_CLINIC_OR_DEPARTMENT_OTHER): Payer: Medicare Other | Admitting: Oncology

## 2016-06-06 VITALS — BP 116/67 | HR 60 | Temp 97.7°F | Resp 18 | Ht 61.5 in | Wt 126.0 lb

## 2016-06-06 DIAGNOSIS — Z8547 Personal history of malignant neoplasm of testis: Secondary | ICD-10-CM

## 2016-06-06 DIAGNOSIS — N393 Stress incontinence (female) (male): Secondary | ICD-10-CM | POA: Diagnosis not present

## 2016-06-06 DIAGNOSIS — C6290 Malignant neoplasm of unspecified testis, unspecified whether descended or undescended: Secondary | ICD-10-CM

## 2016-06-06 NOTE — Progress Notes (Signed)
Hematology and Oncology Follow Up Visit  Justin Savage [id number] Mar 09, 1976 41 y.o. 06/06/2016 4:31 PM  CC: Justin Savage. Justin Savage, M.D.  Justin Coma, MD    Principle Diagnosis: 41 year old gentleman diagnosed with stage II nonseminomatous testis cancer diagnosed in March 2008.   Prior Therapy: 1. He is status post left orchiectomy done in March 2008.  Pathology revealed nonseminomatous testis cancer, 50% embryonal, 20% yolk-sac tumor.  He had an elevated alpha-fetoprotein.  CT scan showed a borderline 1.5-cm periaortic lymph node.  2. He was treated with systemic chemotherapy utilizing cisplatin and etoposide.  Therapy concluded in June 2008 with normalization of his tumor markers without any evidence of anything to suggest recurrent disease.    Current therapy: He is on observation and surveillance.   Interim History:  Justin Savage presents today for a followup visit. Since the last visit, he has not reported any major changes in his health. He does not report any symptoms to suggest cancer recurrence growing abdominal pain or adenopathy. He has reported at times stress incontinence when he does yoga. He does not report any hematuria or dysuria at this point.  He has continued to be active and predominantly his exercise is including walking. He does report a pain in his knee which has caused him to stop running rigorously. He continues to work part-time at [employer] without any hindrance or decline.  He denied any headaches, blurry vision, syncope or seizures. He does not report any fevers or chills or sweats. He does not report any cough, wheezing or hemoptysis. He does not report any nausea, vomiting or abdominal pain. He does not report any frequency urgency or hesitancy. He does not report any skeletal complaints. Does not report any arthralgias or myalgias. Remaining review of systems unremarkable.   Medications: I have reviewed the patient's current medications.   Current Outpatient  Prescriptions  Medication Sig Dispense Refill  . Ascorbic Acid (VITAMIN C) 100 MG tablet Take 100 mg by mouth daily.    . cholecalciferol (VITAMIN D) 1000 units tablet Take 1,000 Units by mouth daily.    . Multiple Vitamin (MULTIVITAMINS PO) Take by mouth daily.      No current facility-administered medications for this visit.     Allergies: No Known Allergies  Past Medical History, Surgical history, Social history, and Family History were reviewed and updated.   Physical Exam: Blood pressure 116/67, pulse 60, temperature 97.7 F (36.5 C), temperature source Oral, resp. rate 18, height 5' 1.5" (1.562 m), weight 126 lb (57.2 kg), SpO2 98 %. ECOG: 0 General appearance: alert week without distress. Head: Normocephalic, without obvious abnormality Neck: no adenopathy, no thyroid masses. Lymph nodes: Cervical, supraclavicular, and axillary nodes normal. Heart:regular rate and rhythm, S1, S2 normal, no murmur, click, rub or gallop Lung:chest clear, no wheezing, rales, normal symmetric air entry Abdomin: soft, non-tender, without masses or organomegaly no rebound or guarding. Genitourinary examination: His testicle appeared normal without any masses. Penis appeared normal without any discharge or erythema. EXT:no erythema, induration, or nodules   Lab Results: Lab Results  Component Value Date   WBC 3.5 (L) 07/13/2013   HGB 16.1 07/13/2013   HCT 47.6 07/13/2013   MCV 97.3 07/13/2013   PLT 268 07/13/2013   Results for Justin Savage (MRN [id number]) as of 07/15/2013 08:36  Ref. Range 04/30/2012 14:51 07/13/2013 09:11  AFP-Tumor Marker Latest Range: 0.0-8.0 ng/mL 3.1 2.9  Beta hCG, Tumor Marker Latest Range: < 5.0 mIU/mL < 0.5  Impression and Plan: 41 year old gentleman with the following issues:  1. Stage IIA nonseminomatous testis cancer.  Status post orchiectomy, followed by chemotherapy utilizing cisplatin and etoposide.  Therapy concluded in 2008 without any evidence  to suggest recurrent disease. His CT scan in 2010 did not show any evidence of recurrent disease. He is tumor markers total 2015 all within normal range. He is close to 10 years out from his therapy without any evidence to suggest recurrent disease. At this time, I see no need for any further oncology follow-up and I'm happy to see him in the future as needed. 2.     Stress incontinence: Unclear etiology at this time. I advised him to follow up with urology if this continues to be an issue. 3.     Follow-up: Will be as needed.  Justin Button, MD 2/16/20184:31 PM

## 2016-10-23 ENCOUNTER — Other Ambulatory Visit: Payer: Self-pay | Admitting: Family Medicine

## 2016-10-23 DIAGNOSIS — N6310 Unspecified lump in the right breast, unspecified quadrant: Secondary | ICD-10-CM

## 2016-10-23 DIAGNOSIS — N62 Hypertrophy of breast: Secondary | ICD-10-CM | POA: Diagnosis not present

## 2016-10-27 ENCOUNTER — Ambulatory Visit
Admission: RE | Admit: 2016-10-27 | Discharge: 2016-10-27 | Disposition: A | Discharge status: Home / Self Care | Payer: Medicare Other | Source: Ambulatory Visit | Attending: Family Medicine | Admitting: Family Medicine

## 2016-10-27 ENCOUNTER — Ambulatory Visit: Payer: Medicare Other

## 2016-10-27 DIAGNOSIS — N6310 Unspecified lump in the right breast, unspecified quadrant: Secondary | ICD-10-CM

## 2016-10-27 DIAGNOSIS — R922 Inconclusive mammogram: Secondary | ICD-10-CM | POA: Diagnosis not present

## 2017-02-10 DIAGNOSIS — Z23 Encounter for immunization: Secondary | ICD-10-CM | POA: Diagnosis not present

## 2017-06-24 DIAGNOSIS — H7413 Adhesive middle ear disease, bilateral: Secondary | ICD-10-CM | POA: Diagnosis not present

## 2017-06-24 DIAGNOSIS — H6123 Impacted cerumen, bilateral: Secondary | ICD-10-CM | POA: Diagnosis not present

## 2017-06-24 DIAGNOSIS — H9072 Mixed conductive and sensorineural hearing loss, unilateral, left ear, with unrestricted hearing on the contralateral side: Secondary | ICD-10-CM | POA: Diagnosis not present

## 2017-06-24 DIAGNOSIS — Q909 Down syndrome, unspecified: Secondary | ICD-10-CM | POA: Diagnosis not present

## 2017-06-24 DIAGNOSIS — H74392 Other acquired abnormalities of left ear ossicles: Secondary | ICD-10-CM | POA: Diagnosis not present

## 2017-06-24 DIAGNOSIS — H7112 Cholesteatoma of tympanum, left ear: Secondary | ICD-10-CM | POA: Diagnosis not present

## 2017-07-13 DIAGNOSIS — B351 Tinea unguium: Secondary | ICD-10-CM | POA: Diagnosis not present

## 2017-07-13 DIAGNOSIS — B353 Tinea pedis: Secondary | ICD-10-CM | POA: Diagnosis not present

## 2017-07-13 DIAGNOSIS — L602 Onychogryphosis: Secondary | ICD-10-CM | POA: Diagnosis not present

## 2017-07-22 DIAGNOSIS — B351 Tinea unguium: Secondary | ICD-10-CM | POA: Diagnosis not present

## 2017-08-03 DIAGNOSIS — H9072 Mixed conductive and sensorineural hearing loss, unilateral, left ear, with unrestricted hearing on the contralateral side: Secondary | ICD-10-CM | POA: Diagnosis not present

## 2017-08-03 DIAGNOSIS — H6123 Impacted cerumen, bilateral: Secondary | ICD-10-CM | POA: Diagnosis not present

## 2017-08-03 DIAGNOSIS — H74392 Other acquired abnormalities of left ear ossicles: Secondary | ICD-10-CM | POA: Diagnosis not present

## 2017-08-03 DIAGNOSIS — H7413 Adhesive middle ear disease, bilateral: Secondary | ICD-10-CM | POA: Diagnosis not present

## 2017-08-03 DIAGNOSIS — Q909 Down syndrome, unspecified: Secondary | ICD-10-CM | POA: Diagnosis not present

## 2017-08-04 DIAGNOSIS — B353 Tinea pedis: Secondary | ICD-10-CM | POA: Diagnosis not present

## 2017-08-04 DIAGNOSIS — L602 Onychogryphosis: Secondary | ICD-10-CM | POA: Diagnosis not present

## 2017-08-07 DIAGNOSIS — Z79899 Other long term (current) drug therapy: Secondary | ICD-10-CM | POA: Diagnosis not present

## 2017-09-02 DIAGNOSIS — B353 Tinea pedis: Secondary | ICD-10-CM | POA: Diagnosis not present

## 2017-09-15 DIAGNOSIS — Z79899 Other long term (current) drug therapy: Secondary | ICD-10-CM | POA: Diagnosis not present

## 2017-10-05 DIAGNOSIS — H5213 Myopia, bilateral: Secondary | ICD-10-CM | POA: Diagnosis not present

## 2017-10-05 DIAGNOSIS — H1045 Other chronic allergic conjunctivitis: Secondary | ICD-10-CM | POA: Diagnosis not present

## 2017-10-05 DIAGNOSIS — H52223 Regular astigmatism, bilateral: Secondary | ICD-10-CM | POA: Diagnosis not present

## 2017-10-30 DIAGNOSIS — H900 Conductive hearing loss, bilateral: Secondary | ICD-10-CM | POA: Diagnosis not present

## 2017-10-30 DIAGNOSIS — B351 Tinea unguium: Secondary | ICD-10-CM | POA: Diagnosis not present

## 2017-10-30 DIAGNOSIS — Z0000 Encounter for general adult medical examination without abnormal findings: Secondary | ICD-10-CM | POA: Diagnosis not present

## 2017-10-30 DIAGNOSIS — Q909 Down syndrome, unspecified: Secondary | ICD-10-CM | POA: Diagnosis not present

## 2017-10-30 DIAGNOSIS — M1710 Unilateral primary osteoarthritis, unspecified knee: Secondary | ICD-10-CM | POA: Diagnosis not present

## 2017-10-30 DIAGNOSIS — M2170 Unequal limb length (acquired), unspecified site: Secondary | ICD-10-CM | POA: Diagnosis not present

## 2017-10-30 DIAGNOSIS — M419 Scoliosis, unspecified: Secondary | ICD-10-CM | POA: Diagnosis not present

## 2017-10-30 DIAGNOSIS — D72819 Decreased white blood cell count, unspecified: Secondary | ICD-10-CM | POA: Diagnosis not present

## 2017-10-30 DIAGNOSIS — N62 Hypertrophy of breast: Secondary | ICD-10-CM | POA: Diagnosis not present

## 2017-10-30 DIAGNOSIS — E559 Vitamin D deficiency, unspecified: Secondary | ICD-10-CM | POA: Diagnosis not present

## 2017-10-30 DIAGNOSIS — Z79899 Other long term (current) drug therapy: Secondary | ICD-10-CM | POA: Diagnosis not present

## 2017-10-30 DIAGNOSIS — C6210 Malignant neoplasm of unspecified descended testis: Secondary | ICD-10-CM | POA: Diagnosis not present

## 2017-11-05 DIAGNOSIS — B353 Tinea pedis: Secondary | ICD-10-CM | POA: Diagnosis not present

## 2017-11-05 DIAGNOSIS — B351 Tinea unguium: Secondary | ICD-10-CM | POA: Diagnosis not present

## 2018-03-08 DIAGNOSIS — Z23 Encounter for immunization: Secondary | ICD-10-CM | POA: Diagnosis not present

## 2018-12-20 DIAGNOSIS — E559 Vitamin D deficiency, unspecified: Secondary | ICD-10-CM | POA: Diagnosis not present

## 2018-12-20 DIAGNOSIS — F429 Obsessive-compulsive disorder, unspecified: Secondary | ICD-10-CM | POA: Diagnosis not present

## 2018-12-20 DIAGNOSIS — M419 Scoliosis, unspecified: Secondary | ICD-10-CM | POA: Diagnosis not present

## 2018-12-20 DIAGNOSIS — Z23 Encounter for immunization: Secondary | ICD-10-CM | POA: Diagnosis not present

## 2018-12-20 DIAGNOSIS — H900 Conductive hearing loss, bilateral: Secondary | ICD-10-CM | POA: Diagnosis not present

## 2018-12-20 DIAGNOSIS — Z8547 Personal history of malignant neoplasm of testis: Secondary | ICD-10-CM | POA: Diagnosis not present

## 2018-12-20 DIAGNOSIS — M2170 Unequal limb length (acquired), unspecified site: Secondary | ICD-10-CM | POA: Diagnosis not present

## 2018-12-20 DIAGNOSIS — Q909 Down syndrome, unspecified: Secondary | ICD-10-CM | POA: Diagnosis not present

## 2018-12-20 DIAGNOSIS — Z0000 Encounter for general adult medical examination without abnormal findings: Secondary | ICD-10-CM | POA: Diagnosis not present

## 2019-03-09 DIAGNOSIS — H5213 Myopia, bilateral: Secondary | ICD-10-CM | POA: Diagnosis not present

## 2019-03-09 DIAGNOSIS — H52223 Regular astigmatism, bilateral: Secondary | ICD-10-CM | POA: Diagnosis not present

## 2019-03-09 DIAGNOSIS — H25033 Anterior subcapsular polar age-related cataract, bilateral: Secondary | ICD-10-CM | POA: Diagnosis not present

## 2019-03-11 DIAGNOSIS — Z9089 Acquired absence of other organs: Secondary | ICD-10-CM | POA: Diagnosis not present

## 2019-03-11 DIAGNOSIS — Q909 Down syndrome, unspecified: Secondary | ICD-10-CM | POA: Diagnosis not present

## 2019-03-11 DIAGNOSIS — H6692 Otitis media, unspecified, left ear: Secondary | ICD-10-CM | POA: Diagnosis not present

## 2019-03-11 DIAGNOSIS — Q382 Macroglossia: Secondary | ICD-10-CM | POA: Diagnosis not present

## 2019-03-11 DIAGNOSIS — H9012 Conductive hearing loss, unilateral, left ear, with unrestricted hearing on the contralateral side: Secondary | ICD-10-CM | POA: Diagnosis not present

## 2019-03-11 DIAGNOSIS — Q161 Congenital absence, atresia and stricture of auditory canal (external): Secondary | ICD-10-CM | POA: Diagnosis not present

## 2019-03-11 DIAGNOSIS — H6123 Impacted cerumen, bilateral: Secondary | ICD-10-CM | POA: Diagnosis not present

## 2019-03-11 DIAGNOSIS — Q02 Microcephaly: Secondary | ICD-10-CM | POA: Diagnosis not present

## 2019-03-11 DIAGNOSIS — H61303 Acquired stenosis of external ear canal, unspecified, bilateral: Secondary | ICD-10-CM | POA: Diagnosis not present

## 2019-03-11 DIAGNOSIS — H7412 Adhesive left middle ear disease: Secondary | ICD-10-CM | POA: Diagnosis not present

## 2019-06-21 DIAGNOSIS — Z23 Encounter for immunization: Secondary | ICD-10-CM | POA: Diagnosis not present

## 2019-07-13 DIAGNOSIS — Z23 Encounter for immunization: Secondary | ICD-10-CM | POA: Diagnosis not present

## 2019-07-15 ENCOUNTER — Ambulatory Visit: Payer: Medicare Other

## 2019-12-08 DIAGNOSIS — H6123 Impacted cerumen, bilateral: Secondary | ICD-10-CM | POA: Diagnosis not present

## 2019-12-08 DIAGNOSIS — H7102 Cholesteatoma of attic, left ear: Secondary | ICD-10-CM | POA: Diagnosis not present

## 2019-12-08 DIAGNOSIS — Q909 Down syndrome, unspecified: Secondary | ICD-10-CM | POA: Diagnosis not present

## 2019-12-08 DIAGNOSIS — H9012 Conductive hearing loss, unilateral, left ear, with unrestricted hearing on the contralateral side: Secondary | ICD-10-CM | POA: Diagnosis not present

## 2019-12-08 DIAGNOSIS — H7412 Adhesive left middle ear disease: Secondary | ICD-10-CM | POA: Diagnosis not present

## 2019-12-08 DIAGNOSIS — Z9089 Acquired absence of other organs: Secondary | ICD-10-CM | POA: Diagnosis not present

## 2019-12-27 ENCOUNTER — Ambulatory Visit: Payer: Medicare Other | Attending: Internal Medicine

## 2019-12-27 DIAGNOSIS — Z23 Encounter for immunization: Secondary | ICD-10-CM

## 2019-12-27 NOTE — Progress Notes (Signed)
   Covid-19 Vaccination Clinic  Name:  PANCHO RUSHING    MRN: [id number] DOB: May 15, 1975  12/27/2019  Mr. Wareing was observed post Covid-19 immunization for 15 minutes without incident. He was provided with Vaccine Information Sheet and instruction to access the V-Safe system.   Mr. Merriweather was instructed to call 911 with any severe reactions post vaccine: Marland Kitchen Difficulty breathing  . Swelling of face and throat  . A fast heartbeat  . A bad rash all over body  . Dizziness and weakness

## 2020-01-04 DIAGNOSIS — D72819 Decreased white blood cell count, unspecified: Secondary | ICD-10-CM | POA: Diagnosis not present

## 2020-01-04 DIAGNOSIS — F429 Obsessive-compulsive disorder, unspecified: Secondary | ICD-10-CM | POA: Diagnosis not present

## 2020-01-04 DIAGNOSIS — Z0000 Encounter for general adult medical examination without abnormal findings: Secondary | ICD-10-CM | POA: Diagnosis not present

## 2020-01-04 DIAGNOSIS — H900 Conductive hearing loss, bilateral: Secondary | ICD-10-CM | POA: Diagnosis not present

## 2020-01-04 DIAGNOSIS — Z8547 Personal history of malignant neoplasm of testis: Secondary | ICD-10-CM | POA: Diagnosis not present

## 2020-01-04 DIAGNOSIS — Z136 Encounter for screening for cardiovascular disorders: Secondary | ICD-10-CM | POA: Diagnosis not present

## 2020-01-04 DIAGNOSIS — Q909 Down syndrome, unspecified: Secondary | ICD-10-CM | POA: Diagnosis not present

## 2020-01-04 DIAGNOSIS — E559 Vitamin D deficiency, unspecified: Secondary | ICD-10-CM | POA: Diagnosis not present

## 2020-01-04 DIAGNOSIS — M1710 Unilateral primary osteoarthritis, unspecified knee: Secondary | ICD-10-CM | POA: Diagnosis not present

## 2020-01-04 DIAGNOSIS — Z23 Encounter for immunization: Secondary | ICD-10-CM | POA: Diagnosis not present

## 2020-01-04 DIAGNOSIS — M419 Scoliosis, unspecified: Secondary | ICD-10-CM | POA: Diagnosis not present

## 2020-01-13 DIAGNOSIS — M25561 Pain in right knee: Secondary | ICD-10-CM | POA: Diagnosis not present

## 2020-01-13 DIAGNOSIS — M25571 Pain in right ankle and joints of right foot: Secondary | ICD-10-CM | POA: Diagnosis not present

## 2020-01-16 DIAGNOSIS — Z01818 Encounter for other preprocedural examination: Secondary | ICD-10-CM | POA: Diagnosis not present

## 2020-01-16 DIAGNOSIS — H9012 Conductive hearing loss, unilateral, left ear, with unrestricted hearing on the contralateral side: Secondary | ICD-10-CM | POA: Diagnosis not present

## 2020-01-16 DIAGNOSIS — M8588 Other specified disorders of bone density and structure, other site: Secondary | ICD-10-CM | POA: Diagnosis not present

## 2020-01-16 DIAGNOSIS — H7102 Cholesteatoma of attic, left ear: Secondary | ICD-10-CM | POA: Diagnosis not present

## 2020-01-18 DIAGNOSIS — H7102 Cholesteatoma of attic, left ear: Secondary | ICD-10-CM | POA: Diagnosis not present

## 2020-01-18 DIAGNOSIS — H9012 Conductive hearing loss, unilateral, left ear, with unrestricted hearing on the contralateral side: Secondary | ICD-10-CM | POA: Diagnosis not present

## 2020-01-18 DIAGNOSIS — H7412 Adhesive left middle ear disease: Secondary | ICD-10-CM | POA: Diagnosis not present

## 2020-01-18 DIAGNOSIS — Q909 Down syndrome, unspecified: Secondary | ICD-10-CM | POA: Diagnosis not present

## 2020-01-18 DIAGNOSIS — H9501 Recurrent cholesteatoma of postmastoidectomy cavity, right ear: Secondary | ICD-10-CM | POA: Diagnosis not present

## 2020-01-18 DIAGNOSIS — H6532 Chronic mucoid otitis media, left ear: Secondary | ICD-10-CM | POA: Diagnosis not present

## 2020-01-26 DIAGNOSIS — Q909 Down syndrome, unspecified: Secondary | ICD-10-CM | POA: Diagnosis not present

## 2020-01-26 DIAGNOSIS — Z9089 Acquired absence of other organs: Secondary | ICD-10-CM | POA: Diagnosis not present

## 2020-01-26 DIAGNOSIS — H7412 Adhesive left middle ear disease: Secondary | ICD-10-CM | POA: Diagnosis not present

## 2020-01-26 DIAGNOSIS — H9012 Conductive hearing loss, unilateral, left ear, with unrestricted hearing on the contralateral side: Secondary | ICD-10-CM | POA: Diagnosis not present

## 2020-01-26 DIAGNOSIS — Z48810 Encounter for surgical aftercare following surgery on the sense organs: Secondary | ICD-10-CM | POA: Diagnosis not present

## 2020-03-01 DIAGNOSIS — Z48810 Encounter for surgical aftercare following surgery on the sense organs: Secondary | ICD-10-CM | POA: Diagnosis not present

## 2020-03-01 DIAGNOSIS — H9012 Conductive hearing loss, unilateral, left ear, with unrestricted hearing on the contralateral side: Secondary | ICD-10-CM | POA: Diagnosis not present

## 2020-03-01 DIAGNOSIS — Q909 Down syndrome, unspecified: Secondary | ICD-10-CM | POA: Diagnosis not present

## 2020-03-01 DIAGNOSIS — H6123 Impacted cerumen, bilateral: Secondary | ICD-10-CM | POA: Diagnosis not present

## 2020-07-11 DIAGNOSIS — H5213 Myopia, bilateral: Secondary | ICD-10-CM | POA: Diagnosis not present

## 2020-07-11 DIAGNOSIS — H52223 Regular astigmatism, bilateral: Secondary | ICD-10-CM | POA: Diagnosis not present

## 2020-07-11 DIAGNOSIS — H26043 Anterior subcapsular polar infantile and juvenile cataract, bilateral: Secondary | ICD-10-CM | POA: Diagnosis not present

## 2020-08-09 ENCOUNTER — Emergency Department (HOSPITAL_COMMUNITY): Payer: Medicare Other

## 2020-08-09 ENCOUNTER — Other Ambulatory Visit: Payer: Self-pay

## 2020-08-09 ENCOUNTER — Emergency Department (HOSPITAL_COMMUNITY)
Admission: EM | Admit: 2020-08-09 | Discharge: 2020-08-09 | Disposition: A | Discharge status: Home / Self Care | Payer: Medicare Other | Attending: Emergency Medicine | Admitting: Emergency Medicine

## 2020-08-09 DIAGNOSIS — Z20822 Contact with and (suspected) exposure to covid-19: Secondary | ICD-10-CM | POA: Diagnosis not present

## 2020-08-09 DIAGNOSIS — R55 Syncope and collapse: Secondary | ICD-10-CM | POA: Diagnosis not present

## 2020-08-09 DIAGNOSIS — W06XXXA Fall from bed, initial encounter: Secondary | ICD-10-CM | POA: Diagnosis not present

## 2020-08-09 DIAGNOSIS — R509 Fever, unspecified: Secondary | ICD-10-CM | POA: Diagnosis not present

## 2020-08-09 DIAGNOSIS — Z8547 Personal history of malignant neoplasm of testis: Secondary | ICD-10-CM | POA: Insufficient documentation

## 2020-08-09 DIAGNOSIS — R42 Dizziness and giddiness: Secondary | ICD-10-CM | POA: Insufficient documentation

## 2020-08-09 DIAGNOSIS — R9431 Abnormal electrocardiogram [ECG] [EKG]: Secondary | ICD-10-CM | POA: Diagnosis not present

## 2020-08-09 DIAGNOSIS — J101 Influenza due to other identified influenza virus with other respiratory manifestations: Secondary | ICD-10-CM | POA: Diagnosis not present

## 2020-08-09 DIAGNOSIS — Y92009 Unspecified place in unspecified non-institutional (private) residence as the place of occurrence of the external cause: Secondary | ICD-10-CM

## 2020-08-09 DIAGNOSIS — S0101XA Laceration without foreign body of scalp, initial encounter: Secondary | ICD-10-CM | POA: Diagnosis not present

## 2020-08-09 DIAGNOSIS — Z23 Encounter for immunization: Secondary | ICD-10-CM | POA: Diagnosis not present

## 2020-08-09 DIAGNOSIS — W19XXXA Unspecified fall, initial encounter: Secondary | ICD-10-CM

## 2020-08-09 DIAGNOSIS — R059 Cough, unspecified: Secondary | ICD-10-CM | POA: Diagnosis not present

## 2020-08-09 DIAGNOSIS — J111 Influenza due to unidentified influenza virus with other respiratory manifestations: Secondary | ICD-10-CM | POA: Diagnosis not present

## 2020-08-09 DIAGNOSIS — S0990XA Unspecified injury of head, initial encounter: Secondary | ICD-10-CM | POA: Diagnosis present

## 2020-08-09 LAB — CBC WITH DIFFERENTIAL/PLATELET
Abs Immature Granulocytes: 0.01 10*3/uL (ref 0.00–0.07)
Basophils Absolute: 0 10*3/uL (ref 0.0–0.1)
Basophils Relative: 0 %
Eosinophils Absolute: 0 10*3/uL (ref 0.0–0.5)
Eosinophils Relative: 0 %
HCT: 46.4 % (ref 39.0–52.0)
Hemoglobin: 16.1 g/dL (ref 13.0–17.0)
Immature Granulocytes: 0 %
Lymphocytes Relative: 6 %
Lymphs Abs: 0.5 10*3/uL — ABNORMAL LOW (ref 0.7–4.0)
MCH: 32.9 pg (ref 26.0–34.0)
MCHC: 34.7 g/dL (ref 30.0–36.0)
MCV: 94.7 fL (ref 80.0–100.0)
Monocytes Absolute: 0.6 10*3/uL (ref 0.1–1.0)
Monocytes Relative: 7 %
Neutro Abs: 7.1 10*3/uL (ref 1.7–7.7)
Neutrophils Relative %: 87 %
Platelets: 215 10*3/uL (ref 150–400)
RBC: 4.9 MIL/uL (ref 4.22–5.81)
RDW: 13.5 % (ref 11.5–15.5)
WBC: 8.2 10*3/uL (ref 4.0–10.5)
nRBC: 0 % (ref 0.0–0.2)

## 2020-08-09 LAB — RESP PANEL BY RT-PCR (FLU A&B, COVID) ARPGX2
Influenza A by PCR: POSITIVE — AB
Influenza B by PCR: NEGATIVE
SARS Coronavirus 2 by RT PCR: NEGATIVE

## 2020-08-09 LAB — BASIC METABOLIC PANEL
Anion gap: 7 (ref 5–15)
BUN: 16 mg/dL (ref 6–20)
CO2: 24 mmol/L (ref 22–32)
Calcium: 8.5 mg/dL — ABNORMAL LOW (ref 8.9–10.3)
Chloride: 105 mmol/L (ref 98–111)
Creatinine, Ser: 0.9 mg/dL (ref 0.61–1.24)
GFR, Estimated: >60 mL/min (ref >60)
Glucose, Bld: 127 mg/dL — ABNORMAL HIGH (ref 70–99)
Potassium: 4.1 mmol/L (ref 3.5–5.1)
Sodium: 136 mmol/L (ref 135–145)

## 2020-08-09 MED ORDER — SODIUM CHLORIDE 0.9 % IV BOLUS: 500.0000 mL | Freq: Once | INTRAVENOUS | Status: DC

## 2020-08-09 MED ORDER — ACETAMINOPHEN 500 MG PO TABS
1000.0000 mg | ORAL_TABLET | Freq: Once | ORAL | Status: AC
  Administered 2020-08-09: 1000 mg via ORAL
  Filled 2020-08-09: qty 2

## 2020-08-09 MED ORDER — OSELTAMIVIR PHOSPHATE 75 MG PO CAPS: 75.0000 mg | ORAL_CAPSULE | Freq: Two times a day (BID) | ORAL | 0 refills | Status: AC

## 2020-08-09 MED ORDER — LIDOCAINE HCL (PF) 1 % IJ SOLN
10.0000 mL | Freq: Once | INTRAMUSCULAR | Status: AC
  Administered 2020-08-09: 10 mL
  Filled 2020-08-09: qty 10

## 2020-08-09 MED ORDER — SODIUM CHLORIDE 0.9 % IV BOLUS
1000.0000 mL | Freq: Once | INTRAVENOUS | Status: AC
  Administered 2020-08-09: 1000 mL via INTRAVENOUS

## 2020-08-09 MED ORDER — TETANUS-DIPHTH-ACELL PERTUSSIS 5-2.5-18.5 LF-MCG/0.5 IM SUSY
0.5000 mL | PREFILLED_SYRINGE | Freq: Once | INTRAMUSCULAR | Status: AC
  Administered 2020-08-09: 0.5 mL via INTRAMUSCULAR
  Filled 2020-08-09: qty 0.5

## 2020-08-09 NOTE — ED Triage Notes (Addendum)
Pt presents to the ED after a fall in the bathroom. Pt states he lost his balance and fell backwards. Small laceration assessed to scalp. Bleeding controlled. Pt denies LOC. No blood thinners. Pt states this is his second fall in the past couple of days.

## 2020-08-09 NOTE — ED Provider Notes (Signed)
[facility] EMERGENCY DEPARTMENT Provider Note   CSN: [id number] Arrival date & time: 08/09/20  8546     History Chief Complaint  Patient presents with  . Fall    Justin Savage is a 45 y.o. male.  HPI Patient is a 45 year old male with past medical history significant for MR although his father describes him as high functioning.  Also with history of low blood pressure his father states systolic blood pressure is usually between 90 and 100.  Per father, 2 days ago patient got out of bed and fell to the ground.  Apparently patient did not completely pass out however felt very lightheaded and seemed to brown out/blackout.  He denied any pain at that time and states that he simply crumbled to the ground.  He does not have any seizure-like activity and recovered quickly  I talked to mother over the phone he states that over the past 2 days, since the fall occurred, patient has had some fatigue and seems more weak, has seemed tired.  This morning she noted that he felt hot to touch.  Patient states that he has had some congestion and runny nose and has been sneezing more.  He denies any chest pain shortness of breath abdominal pain any urinary frequency urgency or dysuria.  He states that he does occasionally cough which has been dry nonproductive.  This morning patient had a fall after using the restroom.  He was standing up when he felt lightheaded and passed out and fell down.  He apparently was able to sit down on the toilet before passing out and therefore suffered only a small fall.  He states he woke up with some bleeding from his head denies any significant headache however.  No nausea vomiting.  No other associate symptoms.    Past Medical History:  Diagnosis Date  . Malignant neoplasm of other and unspecified testis     Patient Active Problem List   Diagnosis Date Noted  . Testis cancer ([place]) 07/15/2013    Past Surgical History:  Procedure Laterality  Date  . HYDROCELE EXCISION / REPAIR    . HYPOSPADIAS CORRECTION         Family History  Problem Relation Age of Onset  . Breast cancer Maternal Aunt        Home Medications Prior to Admission medications   Medication Sig Start Date End Date Taking? Authorizing Provider  Ascorbic Acid (VITAMIN C) 100 MG tablet Take 100 mg by mouth daily.   Yes [provider]  cholecalciferol (VITAMIN D) 1000 units tablet Take 1,000 Units by mouth daily.   Yes [provider]  Multiple Vitamin (MULTIVITAMINS PO) Take 1 tablet by mouth daily.   Yes [provider]  oseltamivir (TAMIFLU) 75 MG capsule Take 1 capsule (75 mg total) by mouth every 12 (twelve) hours. 08/09/20  Yes Jamail Cullers, Ova Freshwater S, PA    Allergies    Red dye  Review of Systems   Review of Systems  Constitutional: Positive for fever. Negative for chills.  HENT: Positive for congestion, rhinorrhea and sneezing. Negative for sore throat.   Eyes: Negative for pain.  Respiratory: Positive for cough. Negative for shortness of breath.   Cardiovascular: Negative for chest pain and leg swelling.  Gastrointestinal: Negative for abdominal pain, diarrhea, nausea and vomiting.  Genitourinary: Negative for dysuria.  Musculoskeletal: Positive for myalgias.  Skin: Negative for rash.  Neurological: Positive for syncope and headaches (after fall). Negative for dizziness.  Physical Exam Updated Vital Signs BP 90/66   Pulse (!) 58   Temp 98.6 F (37 C)   Resp (!) 22   SpO2 99%   Physical Exam Vitals and nursing note reviewed.  Constitutional:      General: He is not in acute distress.    Comments: Pleasant well-appearing 45 year old mentally handicapped male.  In no acute distress.  Sitting comfortably in bed.  Able answer questions appropriately follow commands. No increased work of breathing. Speaking in full sentences.  HENT:     Head:     Comments: Approximately 2 cm gaping laceration to the crown of the  head within the hair.    Nose: Nose normal.     Mouth/Throat:     Mouth: Mucous membranes are dry.  Eyes:     General: No scleral icterus. Cardiovascular:     Rate and Rhythm: Normal rate and regular rhythm.     Pulses: Normal pulses.     Heart sounds: Normal heart sounds.  Pulmonary:     Effort: Pulmonary effort is normal. No respiratory distress.     Breath sounds: No wheezing.  Abdominal:     Palpations: Abdomen is soft.     Tenderness: There is no abdominal tenderness. There is no right CVA tenderness, left CVA tenderness, guarding or rebound.  Musculoskeletal:     Cervical back: Normal range of motion.     Right lower leg: No edema.     Left lower leg: No edema.     Comments: No bony tenderness over joints or long bones of the upper and lower extremities.     No neck or back midline tenderness, step-off, deformity, or bruising. Able to turn head left and right 45 degrees without difficulty.  Full range of motion of upper and lower extremity joints shown after palpation was conducted; with 5/5 symmetrical strength in upper and lower extremities. No chest wall tenderness, no facial or cranial tenderness.   Patient has intact sensation grossly in lower and upper extremities. Intact patellar and ankle reflexes. Patient able to ambulate without difficulty.  Radial and DP pulses palpated BL.   Skin:    General: Skin is warm and dry.     Capillary Refill: Capillary refill takes less than 2 seconds.     Comments: See head exam  Neurological:     Mental Status: He is alert. Mental status is at baseline.  Psychiatric:        Mood and Affect: Mood normal.        Behavior: Behavior normal.     ED Results / Procedures / Treatments   Labs (all labs ordered are listed, but only abnormal results are displayed) Labs Reviewed  RESP PANEL BY RT-PCR (FLU A&B, COVID) ARPGX2 - Abnormal; Notable for the following components:      Result Value   Influenza A by PCR POSITIVE (*)    All  other components within normal limits  CBC WITH DIFFERENTIAL/PLATELET - Abnormal; Notable for the following components:   Lymphs Abs 0.5 (*)    All other components within normal limits  BASIC METABOLIC PANEL - Abnormal; Notable for the following components:   Glucose, Bld 127 (*)    Calcium 8.5 (*)    All other components within normal limits    EKG EKG Interpretation  Date/Time:  Thursday August 09 2020 10:46:18 EDT Ventricular Rate:  79 PR Interval:  129 QRS Duration: 88 QT Interval:  363 QTC Calculation: 417 R Axis:  73 Text Interpretation: Sinus rhythm Confirmed by Fredia Sorrow [phone]) on 08/09/2020 10:51:49 AM   Radiology DG Chest Portable 1 View  Result Date: 08/09/2020 CLINICAL DATA:  Cough and fever.  History of testicular cancer EXAM: PORTABLE CHEST 1 VIEW COMPARISON:  04/23/2012 FINDINGS: Pulmonary hyperinflation. Lungs are clear without focal infiltrate or effusion. Negative for heart failure. No lung nodule identified. IMPRESSION: No active disease. Electronically Signed   By: Franchot Gallo M.D.   On: 08/09/2020 11:07    Procedures .Marland KitchenLaceration Repair  Date/Time: 08/09/2020 5:07 PM Performed by: Tedd Sias, PA Authorized by: Tedd Sias, PA   Consent:    Consent obtained:  Verbal   Consent given by:  Patient   Risks, benefits, and alternatives were discussed: yes     Risks discussed:  Infection, need for additional repair, pain, poor cosmetic result and poor wound healing   Alternatives discussed:  No treatment and delayed treatment Universal protocol:    Procedure explained and questions answered to patient or proxy's satisfaction: yes     Relevant documents present and verified: yes     Test results available: yes     Imaging studies available: yes     Required blood products, implants, devices, and special equipment available: yes     Site/side marked: yes     Immediately prior to procedure, a time out was called: yes     Patient  identity confirmed:  Verbally with patient Anesthesia:    Anesthesia method:  None (Patient requested no analgesia) Laceration details:    Location:  Scalp   Scalp location:  Crown   Length (cm):  2 Exploration:    Hemostasis achieved with:  Direct pressure   Imaging outcome: foreign body not noted     Wound exploration: wound explored through full range of motion     Wound extent: no foreign bodies/material noted and no tendon damage noted     Contaminated: no   Treatment:    Area cleansed with:  Saline   Amount of cleaning:  Standard   Irrigation solution:  Sterile saline   Irrigation method:  Pressure wash   Visualized foreign bodies/material removed: no   Skin repair:    Repair method:  Staples   Number of staples:  2 Approximation:    Approximation:  Close Repair type:    Repair type:  Simple Post-procedure details:    Dressing:  Antibiotic ointment and non-adherent dressing   Procedure completion:  Tolerated well, no immediate complications     Medications Ordered in ED Medications  acetaminophen (TYLENOL) tablet 1,000 mg (1,000 mg Oral Given 08/09/20 1037)  lidocaine (PF) (XYLOCAINE) 1 % injection 10 mL (10 mLs Infiltration Given 08/09/20 1117)  Tdap (BOOSTRIX) injection 0.5 mL (0.5 mLs Intramuscular Given 08/09/20 1052)  sodium chloride 0.9 % bolus 1,000 mL (0 mLs Intravenous Stopped 08/09/20 1315)    ED Course  I have reviewed the triage vital signs and the nursing notes.  Pertinent labs & imaging results that were available during my care of the patient were reviewed by me and considered in my medical decision making (see chart for details).    MDM Rules/Calculators/A&P                          Patient is 45 year old male found to be fluid positive.  He has been weak recently and has had a fall 2 days ago as well as a fall this morning as well  as a fall 2 days ago.  From a trauma exam standpoint he has no significant physical exam findings apart from a small 2  cm laceration of the scalp.  This was repaired with 2 staples.  COVID-negative but influenza A positive.  BMP unremarkable.  CBC without leukocytosis or anemia.  Chest x-ray also unremarkable and EKG without any significant findings.  Patient's blood pressure mildly low at times but he is asymptomatic from a blood pressure standpoint.  Ambulate without difficulty and has had no symptoms.  Received 1 L normal saline as well as tetanus and Tylenol.  Fever resolved.  Patient states he feels much improved.  Would like to be discharged home at this time.  Will discharge with Tamiflu.  Final Clinical Impression(s) / ED Diagnoses Final diagnoses:  Laceration of scalp, initial encounter  Fall in home, initial encounter  Influenza A    Rx / DC Orders ED Discharge Orders         Ordered    oseltamivir (TAMIFLU) 75 MG capsule  Every 12 hours        08/09/20 1341           Pati Gallo [place], [place] 08/10/20 0908    Fredia Sorrow, MD 08/10/20 [phone]

## 2020-08-09 NOTE — ED Notes (Signed)
Ambulated pt in the hallway. Pt denies dizziness at this time.

## 2020-08-09 NOTE — Discharge Instructions (Addendum)
Your influenza test was positive.  Please use Tylenol and ibuprofen as discussed below this will help with fever and control your symptoms.  Drink plenty of water.  Please use Tylenol or ibuprofen for pain.  You may use 600 mg ibuprofen every 6 hours or 1000 mg of Tylenol every 6 hours.  You may choose to alternate between the 2.  This would be most effective.  Not to exceed 4 g of Tylenol within 24 hours.  Not to exceed 3200 mg ibuprofen 24 hours.    Your COVID test was negative and your work-up today was reassuring.  Your blood pressure is on the low side however this seems to be not far from your baseline blood pressure.  Please return to the ER for any new or concerning symptoms.  Hydration will help prevent you from passing out again.  Please have staples removed from your scalp in 7-10 days.  You may use a small amount of antibiotic ointment to your scalp and keep the area clean with warm soapy water.  Notably Tamiflu has red dye in it which-according to your allergy list-causes hyperactive behavior.  As we discussed I do not believe this is a necessary or vital part of treating influenza.

## 2020-08-30 DIAGNOSIS — H7412 Adhesive left middle ear disease: Secondary | ICD-10-CM | POA: Diagnosis not present

## 2020-08-30 DIAGNOSIS — H90A32 Mixed conductive and sensorineural hearing loss, unilateral, left ear with restricted hearing on the contralateral side: Secondary | ICD-10-CM | POA: Diagnosis not present

## 2020-08-30 DIAGNOSIS — H6983 Other specified disorders of Eustachian tube, bilateral: Secondary | ICD-10-CM | POA: Diagnosis not present

## 2020-08-30 DIAGNOSIS — Q909 Down syndrome, unspecified: Secondary | ICD-10-CM | POA: Diagnosis not present

## 2020-08-30 DIAGNOSIS — H6123 Impacted cerumen, bilateral: Secondary | ICD-10-CM | POA: Diagnosis not present

## 2020-08-30 DIAGNOSIS — H6692 Otitis media, unspecified, left ear: Secondary | ICD-10-CM | POA: Diagnosis not present

## 2020-08-30 DIAGNOSIS — Z9089 Acquired absence of other organs: Secondary | ICD-10-CM | POA: Diagnosis not present

## 2020-09-27 ENCOUNTER — Encounter (HOSPITAL_COMMUNITY): Payer: Self-pay | Admitting: Emergency Medicine

## 2020-09-27 ENCOUNTER — Emergency Department (HOSPITAL_COMMUNITY)
Admission: EM | Admit: 2020-09-27 | Discharge: 2020-09-27 | Disposition: A | Discharge status: Home / Self Care | Payer: Medicare Other | Attending: Emergency Medicine | Admitting: Emergency Medicine

## 2020-09-27 ENCOUNTER — Emergency Department (HOSPITAL_COMMUNITY): Payer: Medicare Other

## 2020-09-27 ENCOUNTER — Other Ambulatory Visit: Payer: Self-pay

## 2020-09-27 DIAGNOSIS — R55 Syncope and collapse: Secondary | ICD-10-CM | POA: Diagnosis not present

## 2020-09-27 DIAGNOSIS — Z043 Encounter for examination and observation following other accident: Secondary | ICD-10-CM | POA: Diagnosis not present

## 2020-09-27 DIAGNOSIS — M542 Cervicalgia: Secondary | ICD-10-CM | POA: Diagnosis not present

## 2020-09-27 DIAGNOSIS — Y92830 Public park as the place of occurrence of the external cause: Secondary | ICD-10-CM | POA: Diagnosis not present

## 2020-09-27 DIAGNOSIS — W1830XA Fall on same level, unspecified, initial encounter: Secondary | ICD-10-CM | POA: Insufficient documentation

## 2020-09-27 DIAGNOSIS — Z8549 Personal history of malignant neoplasm of other male genital organs: Secondary | ICD-10-CM | POA: Insufficient documentation

## 2020-09-27 DIAGNOSIS — M25561 Pain in right knee: Secondary | ICD-10-CM | POA: Diagnosis not present

## 2020-09-27 DIAGNOSIS — M25572 Pain in left ankle and joints of left foot: Secondary | ICD-10-CM

## 2020-09-27 DIAGNOSIS — Y9389 Activity, other specified: Secondary | ICD-10-CM | POA: Diagnosis not present

## 2020-09-27 LAB — URINALYSIS, ROUTINE W REFLEX MICROSCOPIC
Bilirubin Urine: NEGATIVE
Glucose, UA: NEGATIVE mg/dL
Hgb urine dipstick: NEGATIVE
Ketones, ur: 5 mg/dL — AB
Leukocytes,Ua: NEGATIVE
Nitrite: NEGATIVE
Protein, ur: NEGATIVE mg/dL
Specific Gravity, Urine: 1.017 (ref 1.005–1.030)
pH: 6 (ref 5.0–8.0)

## 2020-09-27 LAB — CBG MONITORING, ED: Glucose-Capillary: 123 mg/dL — ABNORMAL HIGH (ref 70–99)

## 2020-09-27 LAB — CBC
HCT: 46.8 % (ref 39.0–52.0)
Hemoglobin: 15.8 g/dL (ref 13.0–17.0)
MCH: 32.4 pg (ref 26.0–34.0)
MCHC: 33.8 g/dL (ref 30.0–36.0)
MCV: 96.1 fL (ref 80.0–100.0)
Platelets: 276 10*3/uL (ref 150–400)
RBC: 4.87 MIL/uL (ref 4.22–5.81)
RDW: 14 % (ref 11.5–15.5)
WBC: 3.4 10*3/uL — ABNORMAL LOW (ref 4.0–10.5)
nRBC: 0 % (ref 0.0–0.2)

## 2020-09-27 LAB — BASIC METABOLIC PANEL
Anion gap: 5 (ref 5–15)
BUN: 20 mg/dL (ref 6–20)
CO2: 29 mmol/L (ref 22–32)
Calcium: 9.1 mg/dL (ref 8.9–10.3)
Chloride: 104 mmol/L (ref 98–111)
Creatinine, Ser: 0.82 mg/dL (ref 0.61–1.24)
GFR, Estimated: >60 mL/min (ref >60)
Glucose, Bld: 109 mg/dL — ABNORMAL HIGH (ref 70–99)
Potassium: 4.2 mmol/L (ref 3.5–5.1)
Sodium: 138 mmol/L (ref 135–145)

## 2020-09-27 LAB — TROPONIN I (HIGH SENSITIVITY): Troponin I (High Sensitivity): 2 ng/L (ref <18)

## 2020-09-27 MED ORDER — ACETAMINOPHEN 500 MG PO TABS
1000.0000 mg | ORAL_TABLET | Freq: Once | ORAL | Status: AC
Start: 2020-09-27 — End: 2020-09-27
  Administered 2020-09-27: 1000 mg via ORAL
  Filled 2020-09-27: qty 2

## 2020-09-27 NOTE — ED Provider Notes (Signed)
[place] DEPT Provider Note   CSN: [id number] Arrival date & time: 09/27/20  0950     History Chief Complaint  Patient presents with   Near Syncope    Justin Savage is a 45 y.o. male.  HPI Patient has MR but very high functioning.  He was with a caregiver today.  They walked several blocks to the park.  While at the park, patient was playing on some low, playground equipment.  He started to complain of feeling bad and crying out.  The caregiver recognized that he was going to fall and she caught him.  Reportedly he did not have any hard impact with the ground.  She reports that the patient was saying he was going to pass out but he was yelling and vocalizing throughout the episode.  Although it was called an episode of passing out, by description there was no loss of consciousness with continuous vocalizations.  The patient's main complaint is pain in the back of his neck.  He reports it hurts a lot and he points to the midline of the back of the neck.  He cries out with certain position changes.  He also complained of pain in his left ankle and right knee.  Patient's father does qualify that due to communication barriers sometimes it is difficult to assess level of pain.  He has been at baseline mental status since the episode.  He has been interactive.  Patient is conversant.    Past Medical History:  Diagnosis Date   Malignant neoplasm of other and unspecified testis     Patient Active Problem List   Diagnosis Date Noted   Testis cancer ([place]) 07/15/2013    Past Surgical History:  Procedure Laterality Date   HYDROCELE EXCISION / REPAIR     HYPOSPADIAS CORRECTION         Family History  Problem Relation Age of Onset   Breast cancer Maternal Aunt     Social History   Substance Use Topics   Alcohol use: Never   Drug use: Never    Home Medications Prior to Admission medications   Medication Sig Start Date End Date Taking?  Authorizing Provider  Ascorbic Acid (VITAMIN C) 100 MG tablet Take 100 mg by mouth daily.    [provider]  cholecalciferol (VITAMIN D) 1000 units tablet Take 1,000 Units by mouth daily.    [provider]  Multiple Vitamin (MULTIVITAMINS PO) Take 1 tablet by mouth daily.    [provider]  oseltamivir (TAMIFLU) 75 MG capsule Take 1 capsule (75 mg total) by mouth every 12 (twelve) hours. 08/09/20   Tedd Sias, PA    Allergies    Red dye  Review of Systems   Review of Systems 10 systems reviewed and negative except as per HPI Physical Exam Updated Vital Signs BP 100/79   Pulse 83   Temp (!) 97.4 F (36.3 C) (Oral)   Resp 16   SpO2 99%   Physical Exam Constitutional:      Comments: Patient is alert.  As I enter the room he is expressing that he very badly needs to use the bathroom.  He is appropriately coordinated to sit at the edge of the stretcher and use a urinal.  No respiratory distress.  HENT:     Head: Normocephalic and atraumatic.     Nose: Nose normal.     Mouth/Throat:     Mouth: Mucous membranes are moist.  Pharynx: Oropharynx is clear.  Eyes:     Extraocular Movements: Extraocular movements intact.  Cardiovascular:     Rate and Rhythm: Normal rate and regular rhythm.  Pulmonary:     Effort: Pulmonary effort is normal.     Breath sounds: Normal breath sounds.  Abdominal:     General: There is no distension.     Palpations: Abdomen is soft.     Tenderness: There is no abdominal tenderness. There is no guarding.  Musculoskeletal:        General: Normal range of motion.     Cervical back: Tenderness present.     Comments: Patient does have a superficial abrasion down the anterior lower leg.  No bleeding.  No swelling.  Some tenderness palpation over the right knee.  No effusion or deformity.  Skin:    General: Skin is warm and dry.  Neurological:     Comments: Patient is interactive.  He is situationally oriented.  He  follows commands appropriately.  No focal motor deficits.  He has good grip strength bilaterally.  Good use of bilateral lower extremities.  Psychiatric:        Mood and Affect: Mood normal.    ED Results / Procedures / Treatments   Labs (all labs ordered are listed, but only abnormal results are displayed) Labs Reviewed  BASIC METABOLIC PANEL - Abnormal; Notable for the following components:      Result Value   Glucose, Bld 109 (*)    All other components within normal limits  CBC - Abnormal; Notable for the following components:   WBC 3.4 (*)    All other components within normal limits  URINALYSIS, ROUTINE W REFLEX MICROSCOPIC - Abnormal; Notable for the following components:   Color, Urine AMBER (*)    APPearance HAZY (*)    Ketones, ur 5 (*)    All other components within normal limits  CBG MONITORING, ED - Abnormal; Notable for the following components:   Glucose-Capillary 123 (*)    All other components within normal limits  TROPONIN I (HIGH SENSITIVITY)  TROPONIN I (HIGH SENSITIVITY)    EKG EKG Interpretation  Date/Time:  Thursday September 27 2020 10:10:42 EDT Ventricular Rate:  69 PR Interval:  143 QRS Duration: 95 QT Interval:  405 QTC Calculation: 434 R Axis:   83 Text Interpretation: Sinus rhythm Baseline wander in lead(s) II III aVF V5 V6 12 Lead; Mason-Likar normal, no change from previous Confirmed by Charlesetta Shanks [phone]) on 09/27/2020 3:32:40 PM  Radiology DG Ankle 2 Views Left  Result Date: 09/27/2020 CLINICAL DATA:  Fall EXAM: LEFT ANKLE - 2 VIEW COMPARISON:  None. FINDINGS: There is no evidence of acute fracture or dislocation. Large os trigonum. There is mild tibiotalar arthritis. IMPRESSION: No evidence of acute fracture.  Mild tibiotalar arthritis. Electronically Signed   By: Maurine Simmering   On: 09/27/2020 12:08   CT Head Wo Contrast  Result Date: 09/27/2020 CLINICAL DATA:  Syncopal episode, fall, posterior neck pain EXAM: CT HEAD WITHOUT CONTRAST CT  CERVICAL SPINE WITHOUT CONTRAST TECHNIQUE: Multidetector CT imaging of the head and cervical spine was performed following the standard protocol without intravenous contrast. Multiplanar CT image reconstructions of the cervical spine were also generated. COMPARISON:  CT cervical spine 12/06/2005 FINDINGS: CT HEAD FINDINGS Brain: Cavum septum pellucidum. Otherwise normal ventricular morphology. No midline shift or mass effect. Normal appearance of brain parenchyma. No intracranial hemorrhage, mass lesion, or evidence of acute infarction. No extra-axial fluid collections. Vascular: No hyperdense  vessels. Skull: Intact Sinuses/Orbits: Clear Other: N/A CT CERVICAL SPINE FINDINGS Alignment: Mild anterolisthesis at C2-C3, new, with a significant facet degenerative changes RIGHT C2-C3. Remaining alignments normal. Skull base and vertebrae: Vertebral body heights maintained. Disc space narrowing and endplate spur formation at C4-C5 and C5-C6, less C6-C7. No fracture, additional subluxation, or bone destruction. Additional minor scattered facet degenerative changes. Skull base intact. Incidentally noted spina bifida occulta C7, developmental anomaly. Soft tissues and spinal canal: Prevertebral soft tissues normal thickness. Disc levels:  No specific abnormalities Upper chest: Lung apices clear Other: N/A IMPRESSION: No acute intracranial abnormalities. Degenerative disc and facet disease changes of the cervical spine as above. New mild anterolisthesis at C2-C3, likely degenerative. No acute cervical spine abnormalities. Electronically Signed   By: Lavonia Dana M.D.   On: 09/27/2020 17:45   CT Cervical Spine Wo Contrast  Result Date: 09/27/2020 CLINICAL DATA:  Syncopal episode, fall, posterior neck pain EXAM: CT HEAD WITHOUT CONTRAST CT CERVICAL SPINE WITHOUT CONTRAST TECHNIQUE: Multidetector CT imaging of the head and cervical spine was performed following the standard protocol without intravenous contrast. Multiplanar CT  image reconstructions of the cervical spine were also generated. COMPARISON:  CT cervical spine 12/06/2005 FINDINGS: CT HEAD FINDINGS Brain: Cavum septum pellucidum. Otherwise normal ventricular morphology. No midline shift or mass effect. Normal appearance of brain parenchyma. No intracranial hemorrhage, mass lesion, or evidence of acute infarction. No extra-axial fluid collections. Vascular: No hyperdense vessels. Skull: Intact Sinuses/Orbits: Clear Other: N/A CT CERVICAL SPINE FINDINGS Alignment: Mild anterolisthesis at C2-C3, new, with a significant facet degenerative changes RIGHT C2-C3. Remaining alignments normal. Skull base and vertebrae: Vertebral body heights maintained. Disc space narrowing and endplate spur formation at C4-C5 and C5-C6, less C6-C7. No fracture, additional subluxation, or bone destruction. Additional minor scattered facet degenerative changes. Skull base intact. Incidentally noted spina bifida occulta C7, developmental anomaly. Soft tissues and spinal canal: Prevertebral soft tissues normal thickness. Disc levels:  No specific abnormalities Upper chest: Lung apices clear Other: N/A IMPRESSION: No acute intracranial abnormalities. Degenerative disc and facet disease changes of the cervical spine as above. New mild anterolisthesis at C2-C3, likely degenerative. No acute cervical spine abnormalities. Electronically Signed   By: Lavonia Dana M.D.   On: 09/27/2020 17:45   DG Knee Complete 4 Views Right  Result Date: 09/27/2020 CLINICAL DATA:  Fall EXAM: RIGHT KNEE - COMPLETE 4+ VIEW COMPARISON:  None. FINDINGS: There is no evidence of acute fracture. There is mild tricompartment degenerative change. IMPRESSION: No evidence of acute fracture. Mild tricompartment degenerative change. Electronically Signed   By: Maurine Simmering   On: 09/27/2020 12:09    Procedures Procedures   Medications Ordered in ED Medications  acetaminophen (TYLENOL) tablet 1,000 mg (1,000 mg Oral Given 09/27/20 1651)     ED Course  I have reviewed the triage vital signs and the nursing notes.  Pertinent labs & imaging results that were available during my care of the patient were reviewed by me and considered in my medical decision making (see chart for details).    MDM Rules/Calculators/A&P                          Patient is alert without evident motor deficits.  He had an episode while playing at the park that was described as loss of consciousness, however the caregiver reports that the patient was vocalizing throughout the whole episode stating that he felt like he would pass out but does not sound  there was actually any loss of consciousness.  Is unclear if the patient had any fall.  Per the caregiver there was no fall however the patient does have an abrasion to the anterior shin is a complaining of pain in his ankle and right knee.  He also is complaining of pain in the cervical spine.  Imaging of the lower extremities is negative.  We will also proceed with head and C-spine imaging due to pain complaints and slightly ambiguous history of any trauma.  We will proceed with acetaminophen for pain control.  Will also add CT head and C-spine due to complaints of pain in the back of the neck.  Patient's mental status is clear and no focal motor deficits.  Diagnostic work-up does not show any acute findings.  Osteoarthritis is present on CT scan of the neck.  This may be contributing to pain.  Patient is neurologically intact.  No elevation in troponin.  No sign of MI.  Patient has been alert with stable vital signs throughout his stay in the emergency department.  At this time, stable for outpatient follow-up with PCP. Final Clinical Impression(s) / ED Diagnoses Final diagnoses:  Near syncope  Neck pain  Acute left ankle pain    Rx / DC Orders ED Discharge Orders     None        Charlesetta Shanks, MD 09/27/20 1801

## 2020-09-27 NOTE — Discharge Instructions (Addendum)
1.  Take extra strength Tylenol every 6 hours if needed for neck pain or ankle pain.  Elevate and ice sore areas of the knee or the ankle. 2.  Your CT scan of the neck shows a lot of changes of osteoarthritis.  This may cause chronic pain.  Follow-up with your doctor for further evaluation and referral to a specialist if needed.

## 2020-09-27 NOTE — ED Provider Notes (Signed)
Emergency Medicine Provider Triage Evaluation Note  Justin Savage , a 45 y.o. male  was evaluated in triage.  Pt complains of syncope and fall.  [place] the park with a caregiver [place] when he seemed to have a syncopal episode and fell to the ground, then complaining of injury to the left ankle and right knee.  No prior history of syncope or seizures.  Currently denies any chest pain or shortness of breath.  Caregiver was able to catch him and he did not fall to the ground  Review of Systems  Positive: Syncope, joint pain Negative: Chest pain, shortness of breath, head injury, seizure-like activity  Physical Exam  BP 96/66 (BP Location: Left Arm)   Pulse 81   Temp 97.9 F (36.6 C) (Oral)   Resp 16   SpO2 100%  Gen:   Awake, no distress   Resp:  Normal effort  MSK:   Moves extremities without difficulty  Other:  Some tenderness and abrasions to the left shin and right knee  Medical Decision Making  Medically screening exam initiated at 10:41 AM.  Appropriate orders placed.  Justin Savage was informed that the remainder of the evaluation will be completed by another provider, this initial triage assessment does not replace that evaluation, and the importance of remaining in the ED until their evaluation is complete.     Jacqlyn Larsen, PA-C 09/27/20 1121    Fredia Sorrow, MD 10/03/20 [phone]

## 2020-09-27 NOTE — ED Triage Notes (Signed)
Per family, states he was with his caregiver at a play ground when patient appeared to have a syncopal episode-patient fell to ground and was screaming with his eyes of-injured his right knee and left ankle-no history of seizures or heart conditions

## 2020-10-01 DIAGNOSIS — M25572 Pain in left ankle and joints of left foot: Secondary | ICD-10-CM | POA: Diagnosis not present

## 2020-10-17 DIAGNOSIS — M25572 Pain in left ankle and joints of left foot: Secondary | ICD-10-CM | POA: Diagnosis not present

## 2020-11-07 DIAGNOSIS — M25572 Pain in left ankle and joints of left foot: Secondary | ICD-10-CM | POA: Diagnosis not present

## 2020-12-05 DIAGNOSIS — M25572 Pain in left ankle and joints of left foot: Secondary | ICD-10-CM | POA: Diagnosis not present

## 2021-01-31 DIAGNOSIS — D72819 Decreased white blood cell count, unspecified: Secondary | ICD-10-CM | POA: Diagnosis not present

## 2021-01-31 DIAGNOSIS — Z23 Encounter for immunization: Secondary | ICD-10-CM | POA: Diagnosis not present

## 2021-01-31 DIAGNOSIS — M1710 Unilateral primary osteoarthritis, unspecified knee: Secondary | ICD-10-CM | POA: Diagnosis not present

## 2021-01-31 DIAGNOSIS — Z125 Encounter for screening for malignant neoplasm of prostate: Secondary | ICD-10-CM | POA: Diagnosis not present

## 2021-01-31 DIAGNOSIS — Q909 Down syndrome, unspecified: Secondary | ICD-10-CM | POA: Diagnosis not present

## 2021-01-31 DIAGNOSIS — Z0000 Encounter for general adult medical examination without abnormal findings: Secondary | ICD-10-CM | POA: Diagnosis not present

## 2021-01-31 DIAGNOSIS — E559 Vitamin D deficiency, unspecified: Secondary | ICD-10-CM | POA: Diagnosis not present

## 2021-01-31 DIAGNOSIS — F429 Obsessive-compulsive disorder, unspecified: Secondary | ICD-10-CM | POA: Diagnosis not present

## 2021-01-31 DIAGNOSIS — M419 Scoliosis, unspecified: Secondary | ICD-10-CM | POA: Diagnosis not present

## 2021-01-31 DIAGNOSIS — M2170 Unequal limb length (acquired), unspecified site: Secondary | ICD-10-CM | POA: Diagnosis not present

## 2021-01-31 DIAGNOSIS — M5030 Other cervical disc degeneration, unspecified cervical region: Secondary | ICD-10-CM | POA: Diagnosis not present

## 2021-01-31 DIAGNOSIS — Z8547 Personal history of malignant neoplasm of testis: Secondary | ICD-10-CM | POA: Diagnosis not present

## 2021-01-31 DIAGNOSIS — M549 Dorsalgia, unspecified: Secondary | ICD-10-CM | POA: Diagnosis not present

## 2021-02-28 DIAGNOSIS — H6123 Impacted cerumen, bilateral: Secondary | ICD-10-CM | POA: Diagnosis not present

## 2021-02-28 DIAGNOSIS — H61301 Acquired stenosis of right external ear canal, unspecified: Secondary | ICD-10-CM | POA: Diagnosis not present

## 2021-02-28 DIAGNOSIS — Z9089 Acquired absence of other organs: Secondary | ICD-10-CM | POA: Diagnosis not present

## 2021-02-28 DIAGNOSIS — Q909 Down syndrome, unspecified: Secondary | ICD-10-CM | POA: Diagnosis not present

## 2021-02-28 DIAGNOSIS — H90A32 Mixed conductive and sensorineural hearing loss, unilateral, left ear with restricted hearing on the contralateral side: Secondary | ICD-10-CM | POA: Diagnosis not present

## 2021-02-28 DIAGNOSIS — H7102 Cholesteatoma of attic, left ear: Secondary | ICD-10-CM | POA: Diagnosis not present

## 2021-02-28 DIAGNOSIS — Z8781 Personal history of (healed) traumatic fracture: Secondary | ICD-10-CM | POA: Diagnosis not present

## 2021-02-28 DIAGNOSIS — Z9889 Other specified postprocedural states: Secondary | ICD-10-CM | POA: Diagnosis not present

## 2021-09-05 DIAGNOSIS — H7102 Cholesteatoma of attic, left ear: Secondary | ICD-10-CM | POA: Diagnosis not present

## 2021-09-05 DIAGNOSIS — H6993 Unspecified Eustachian tube disorder, bilateral: Secondary | ICD-10-CM | POA: Diagnosis not present

## 2021-09-05 DIAGNOSIS — H6983 Other specified disorders of Eustachian tube, bilateral: Secondary | ICD-10-CM | POA: Diagnosis not present

## 2021-09-05 DIAGNOSIS — Q909 Down syndrome, unspecified: Secondary | ICD-10-CM | POA: Diagnosis not present

## 2021-09-05 DIAGNOSIS — H90A32 Mixed conductive and sensorineural hearing loss, unilateral, left ear with restricted hearing on the contralateral side: Secondary | ICD-10-CM | POA: Diagnosis not present

## 2021-09-05 DIAGNOSIS — H6123 Impacted cerumen, bilateral: Secondary | ICD-10-CM | POA: Diagnosis not present

## 2021-09-05 DIAGNOSIS — H6042 Cholesteatoma of left external ear: Secondary | ICD-10-CM | POA: Diagnosis not present

## 2021-09-05 DIAGNOSIS — Z9089 Acquired absence of other organs: Secondary | ICD-10-CM | POA: Diagnosis not present

## 2021-09-05 DIAGNOSIS — H7412 Adhesive left middle ear disease: Secondary | ICD-10-CM | POA: Diagnosis not present

## 2021-09-05 DIAGNOSIS — H6692 Otitis media, unspecified, left ear: Secondary | ICD-10-CM | POA: Diagnosis not present

## 2021-11-18 DIAGNOSIS — H25033 Anterior subcapsular polar age-related cataract, bilateral: Secondary | ICD-10-CM | POA: Diagnosis not present

## 2021-11-18 DIAGNOSIS — H5213 Myopia, bilateral: Secondary | ICD-10-CM | POA: Diagnosis not present

## 2021-11-18 DIAGNOSIS — H52223 Regular astigmatism, bilateral: Secondary | ICD-10-CM | POA: Diagnosis not present

## 2022-02-19 DIAGNOSIS — M419 Scoliosis, unspecified: Secondary | ICD-10-CM | POA: Diagnosis not present

## 2022-02-19 DIAGNOSIS — Z0000 Encounter for general adult medical examination without abnormal findings: Secondary | ICD-10-CM | POA: Diagnosis not present

## 2022-02-19 DIAGNOSIS — H900 Conductive hearing loss, bilateral: Secondary | ICD-10-CM | POA: Diagnosis not present

## 2022-02-19 DIAGNOSIS — D72819 Decreased white blood cell count, unspecified: Secondary | ICD-10-CM | POA: Diagnosis not present

## 2022-02-19 DIAGNOSIS — M2170 Unequal limb length (acquired), unspecified site: Secondary | ICD-10-CM | POA: Diagnosis not present

## 2022-02-19 DIAGNOSIS — E559 Vitamin D deficiency, unspecified: Secondary | ICD-10-CM | POA: Diagnosis not present

## 2022-02-19 DIAGNOSIS — M5030 Other cervical disc degeneration, unspecified cervical region: Secondary | ICD-10-CM | POA: Diagnosis not present

## 2022-02-19 DIAGNOSIS — Z8547 Personal history of malignant neoplasm of testis: Secondary | ICD-10-CM | POA: Diagnosis not present

## 2022-02-19 DIAGNOSIS — Q909 Down syndrome, unspecified: Secondary | ICD-10-CM | POA: Diagnosis not present

## 2022-02-19 DIAGNOSIS — B351 Tinea unguium: Secondary | ICD-10-CM | POA: Diagnosis not present

## 2022-02-19 DIAGNOSIS — M13861 Other specified arthritis, right knee: Secondary | ICD-10-CM | POA: Diagnosis not present

## 2022-02-19 DIAGNOSIS — Z23 Encounter for immunization: Secondary | ICD-10-CM | POA: Diagnosis not present

## 2022-02-20 DIAGNOSIS — D72819 Decreased white blood cell count, unspecified: Secondary | ICD-10-CM | POA: Diagnosis not present

## 2022-02-20 DIAGNOSIS — E559 Vitamin D deficiency, unspecified: Secondary | ICD-10-CM | POA: Diagnosis not present

## 2022-02-20 DIAGNOSIS — Q909 Down syndrome, unspecified: Secondary | ICD-10-CM | POA: Diagnosis not present

## 2022-03-06 DIAGNOSIS — Z9089 Acquired absence of other organs: Secondary | ICD-10-CM | POA: Diagnosis not present

## 2022-03-06 DIAGNOSIS — M170 Bilateral primary osteoarthritis of knee: Secondary | ICD-10-CM | POA: Diagnosis not present

## 2022-03-06 DIAGNOSIS — H6042 Cholesteatoma of left external ear: Secondary | ICD-10-CM | POA: Diagnosis not present

## 2022-03-06 DIAGNOSIS — H7412 Adhesive left middle ear disease: Secondary | ICD-10-CM | POA: Diagnosis not present

## 2022-03-06 DIAGNOSIS — H6692 Otitis media, unspecified, left ear: Secondary | ICD-10-CM | POA: Diagnosis not present

## 2022-03-06 DIAGNOSIS — H90A32 Mixed conductive and sensorineural hearing loss, unilateral, left ear with restricted hearing on the contralateral side: Secondary | ICD-10-CM | POA: Diagnosis not present

## 2022-03-06 DIAGNOSIS — Q909 Down syndrome, unspecified: Secondary | ICD-10-CM | POA: Diagnosis not present

## 2022-03-06 DIAGNOSIS — Z48810 Encounter for surgical aftercare following surgery on the sense organs: Secondary | ICD-10-CM | POA: Diagnosis not present

## 2022-03-26 IMAGING — CT CT HEAD W/O CM
3 series · 15 of 47 positions shown, 18 images · non-contrast
Comparison: CT cervical spine 12/06/2005

CLINICAL DATA: Syncopal episode, fall, posterior neck pain

EXAM:
CT HEAD WITHOUT CONTRAST
CT CERVICAL SPINE WITHOUT CONTRAST
TECHNIQUE: Multidetector CT imaging of the head and cervical spine was
performed following the standard protocol without intravenous
contrast. Multiplanar CT image reconstructions of the cervical spine
were also generated.

[Series 3: head wo · axial · 0.41mm/px · z∈[-78,+47]mm · 9 of 30 slices shown, 12 images]
[im 3/30  brain]
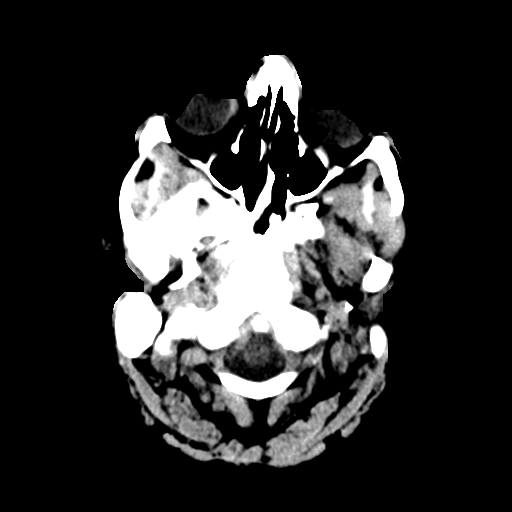
[im 3/30  bone]
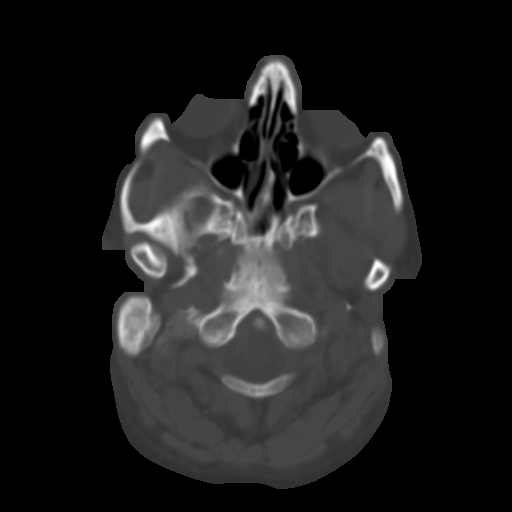
[im 6/30  brain]
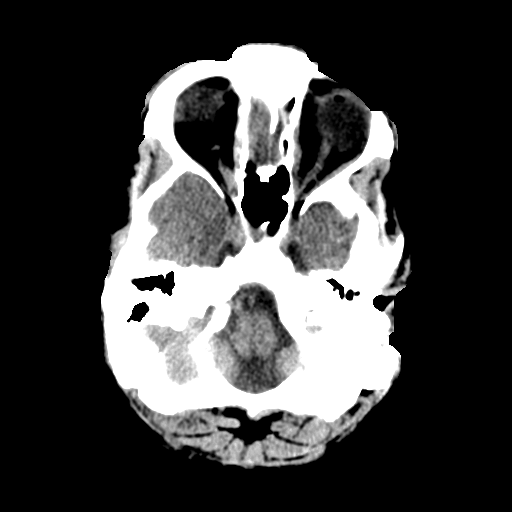
[im 9/30  brain]
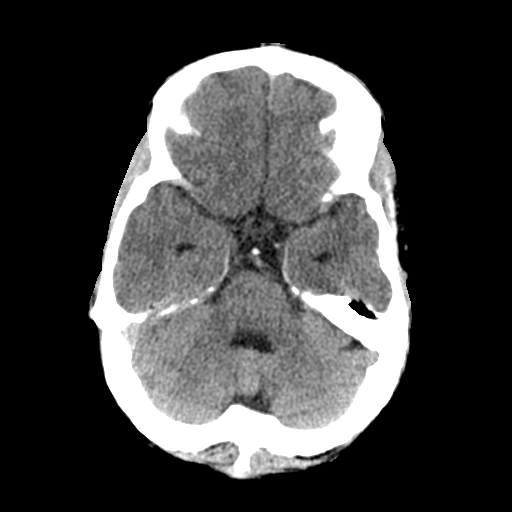
[im 12/30  brain]
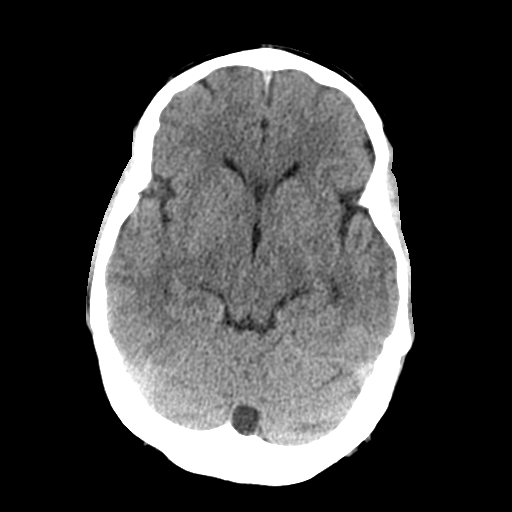
[im 16/30  brain]
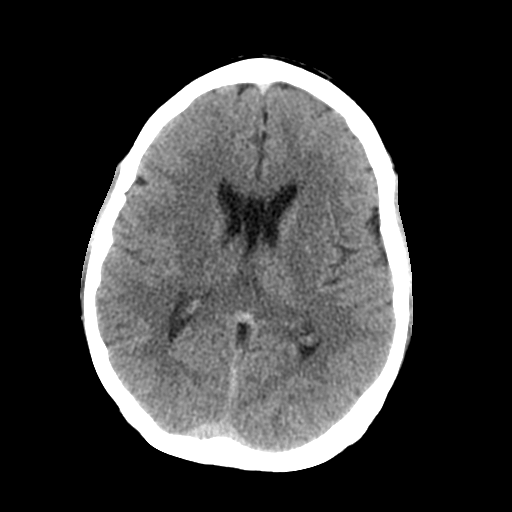
[im 16/30  bone]
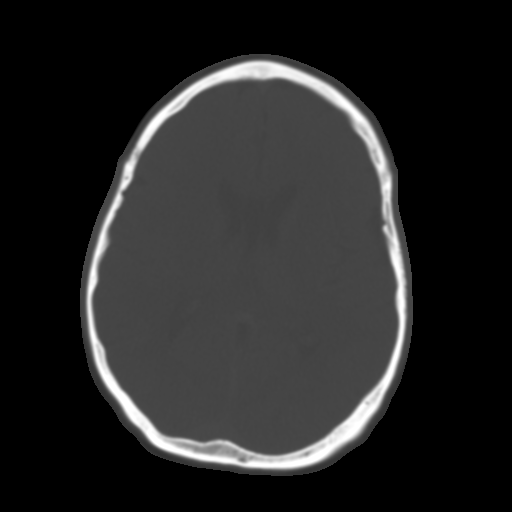
[im 19/30  brain]
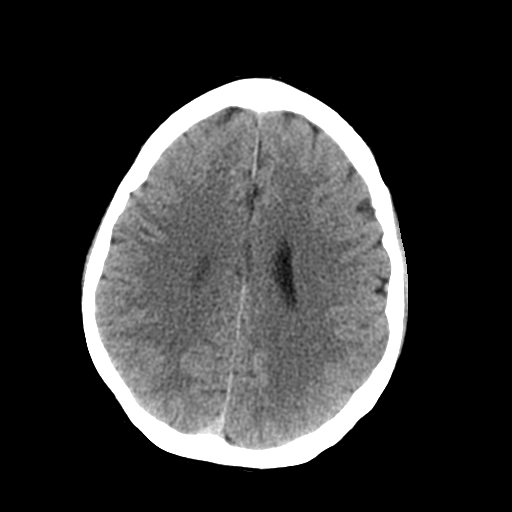
[im 22/30  brain]
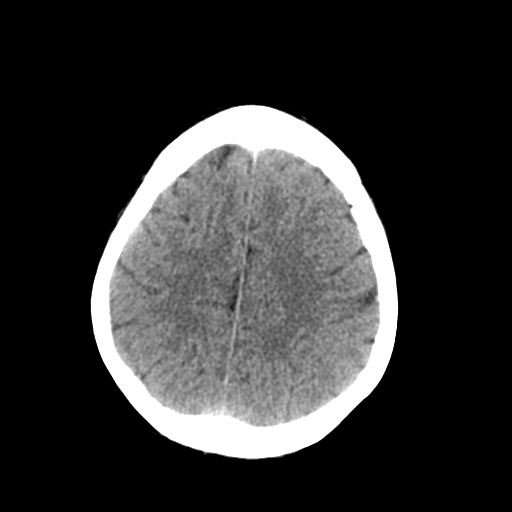
[im 25/30  brain]
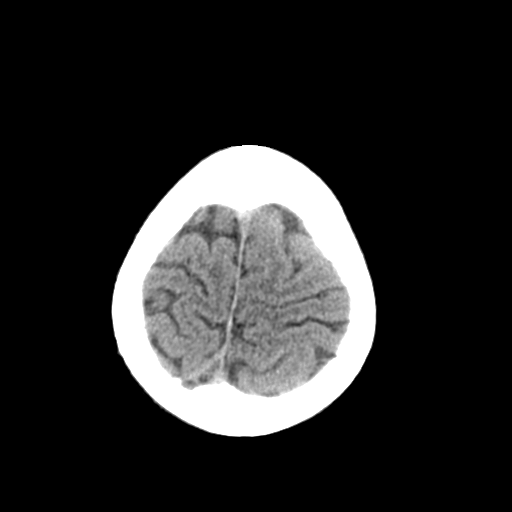
[im 28/30  brain]
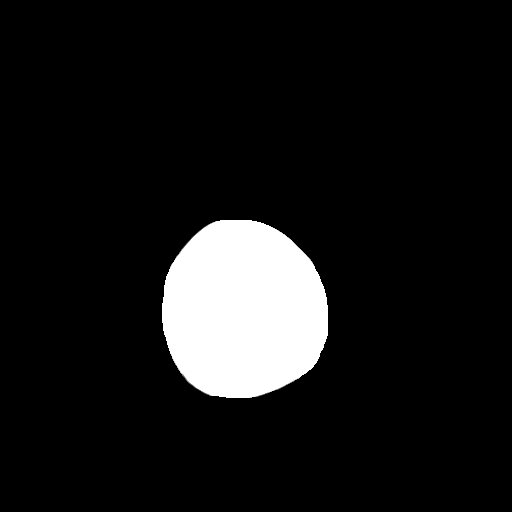
[im 28/30  bone]
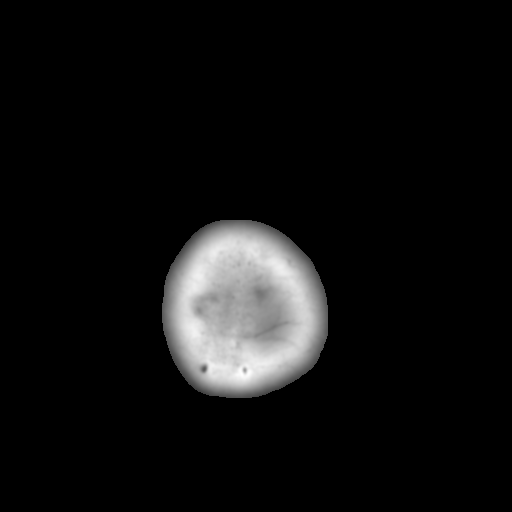

[Series 6: coronal soft tissue · coronal · 0.28mm/px · 3 of 64 slices shown]
[im 22/64  brain]
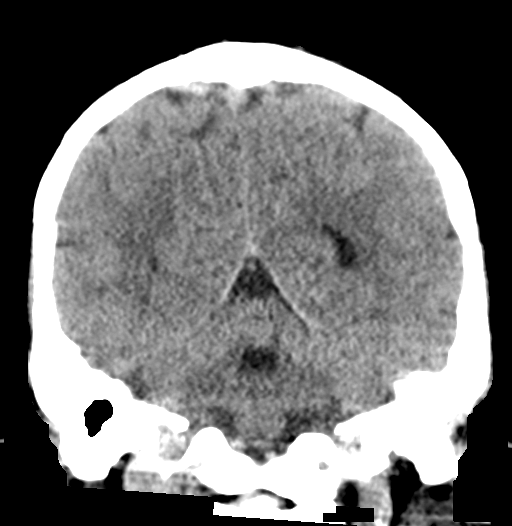
[im 29/64  brain]
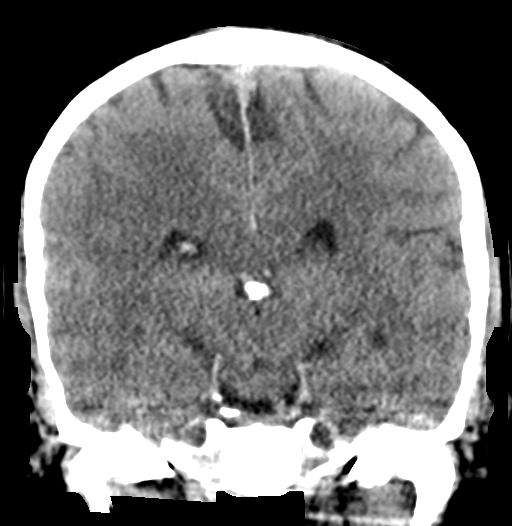
[im 36/64  brain]
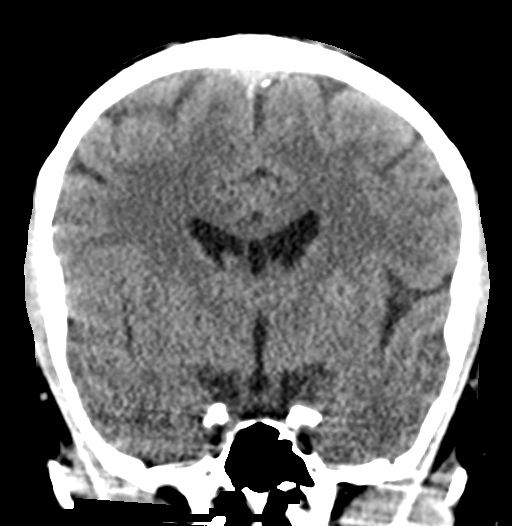

[Series 7: sagittal soft tissue · sagittal · 0.29mm/px · 3 of 49 slices shown]
[im 17/49  brain]
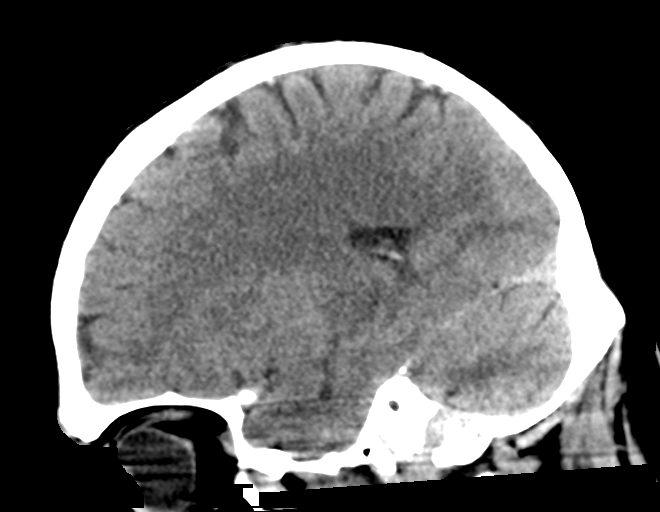
[im 25/49  brain]
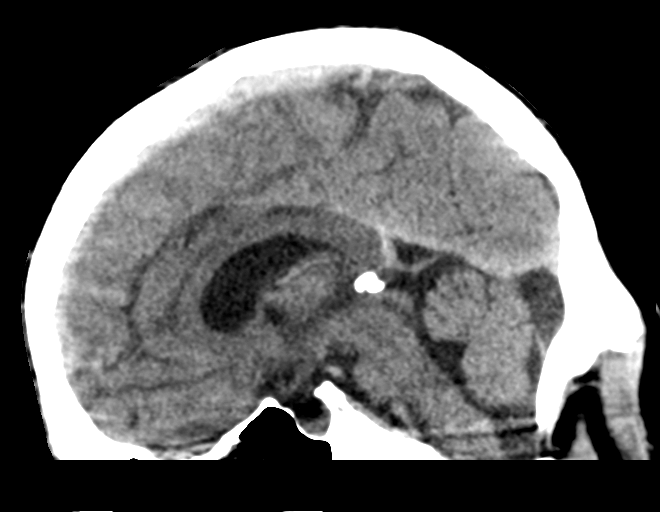
[im 33/49  brain]
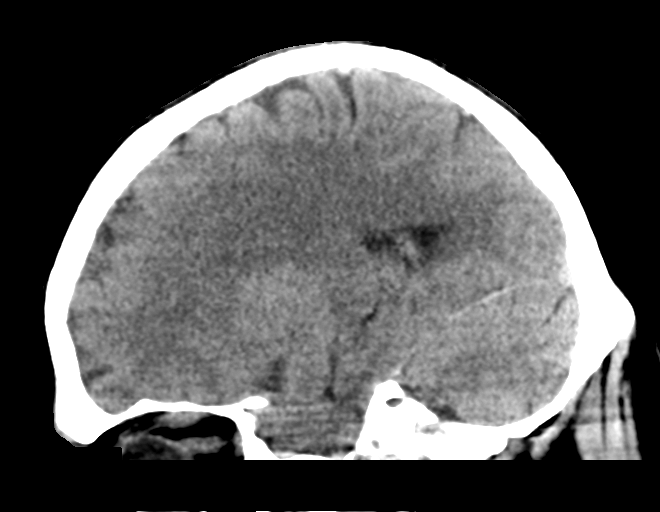

[15 of 47 positions shown; findings below may reference images not displayed]

FINDINGS: CT HEAD FINDINGS

Brain: Cavum septum pellucidum. Otherwise normal ventricular
morphology. No midline shift or mass effect. Normal appearance of
brain parenchyma. No intracranial hemorrhage, mass lesion, or
evidence of acute infarction. No extra-axial fluid collections.

Vascular: No hyperdense vessels.

Skull: Intact

Sinuses/Orbits: Clear

Other: N/A

CT CERVICAL SPINE FINDINGS

Alignment: Mild anterolisthesis at C2-C3, new, with a significant
facet degenerative changes RIGHT C2-C3. Remaining alignments normal.

Skull base and vertebrae: Vertebral body heights maintained. Disc
space narrowing and endplate spur formation at C4-C5 and C5-C6, less
C6-C7. No fracture, additional subluxation, or bone destruction.
Additional minor scattered facet degenerative changes. Skull base
intact. Incidentally noted spina bifida occulta C7, developmental
anomaly.

Soft tissues and spinal canal: Prevertebral soft tissues normal
thickness.

Disc levels:  No specific abnormalities

Upper chest: Lung apices clear

Other: N/A
IMPRESSION: No acute intracranial abnormalities.

Degenerative disc and facet disease changes of the cervical spine as
above.

New mild anterolisthesis at C2-C3, likely degenerative.

No acute cervical spine abnormalities.

## 2022-03-26 IMAGING — CT CT CERVICAL SPINE W/O CM
3 of 5 series · 9 of 33 positions shown, 11 images · non-contrast
Comparison: CT cervical spine 12/06/2005

CLINICAL DATA: Syncopal episode, fall, posterior neck pain

EXAM:
CT HEAD WITHOUT CONTRAST
CT CERVICAL SPINE WITHOUT CONTRAST
TECHNIQUE: Multidetector CT imaging of the head and cervical spine was
performed following the standard protocol without intravenous
contrast. Multiplanar CT image reconstructions of the cervical spine
were also generated.

[Series 7: orthogonal bone · axial · 0.19mm/px · z∈[-164,-164]mm · 1 of 104 slices shown, 2 images]
[im 62/104  soft-tissue]
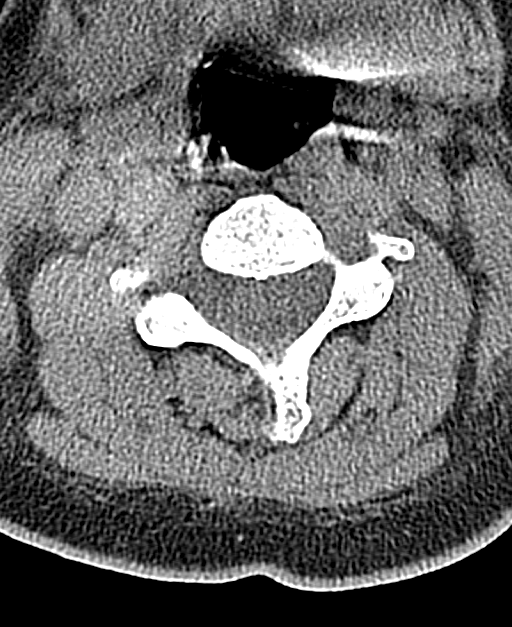
[im 62/104  bone]
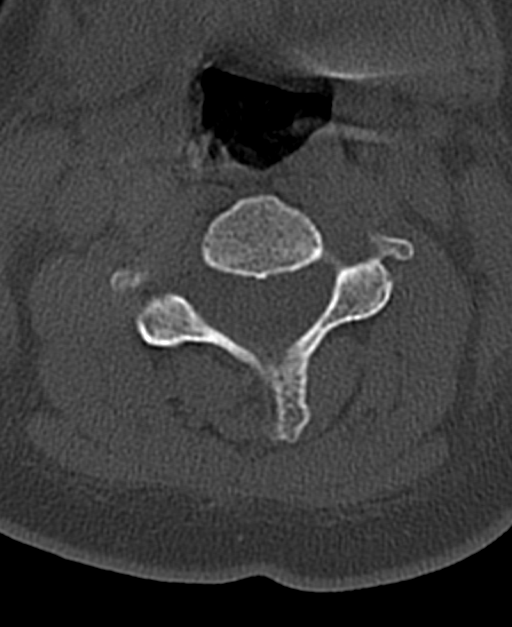

[Series 8: coronal bone · coronal · 0.19mm/px · 3 of 61 slices shown]
[im 13/61  bone]
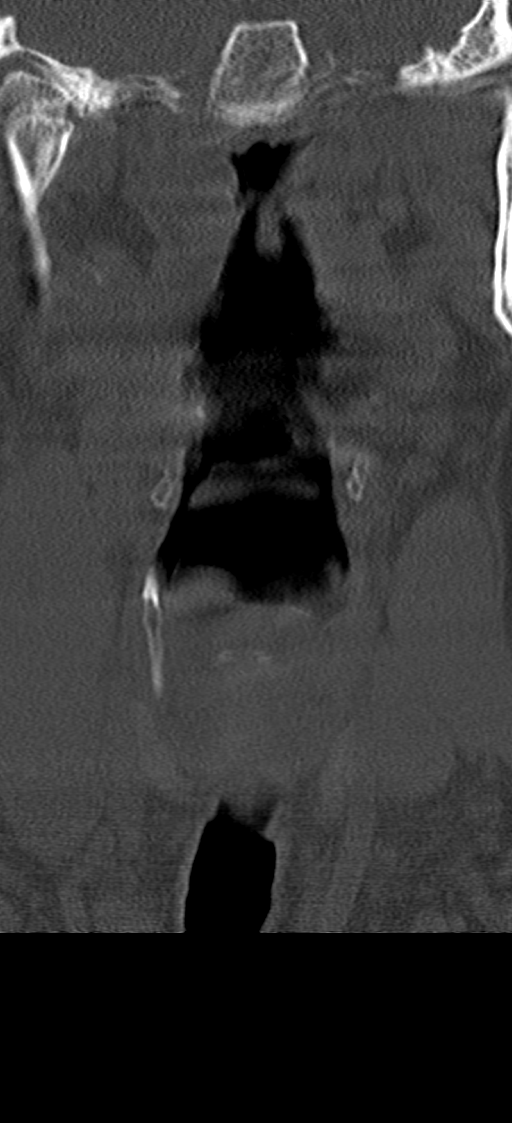
[im 25/61  bone]
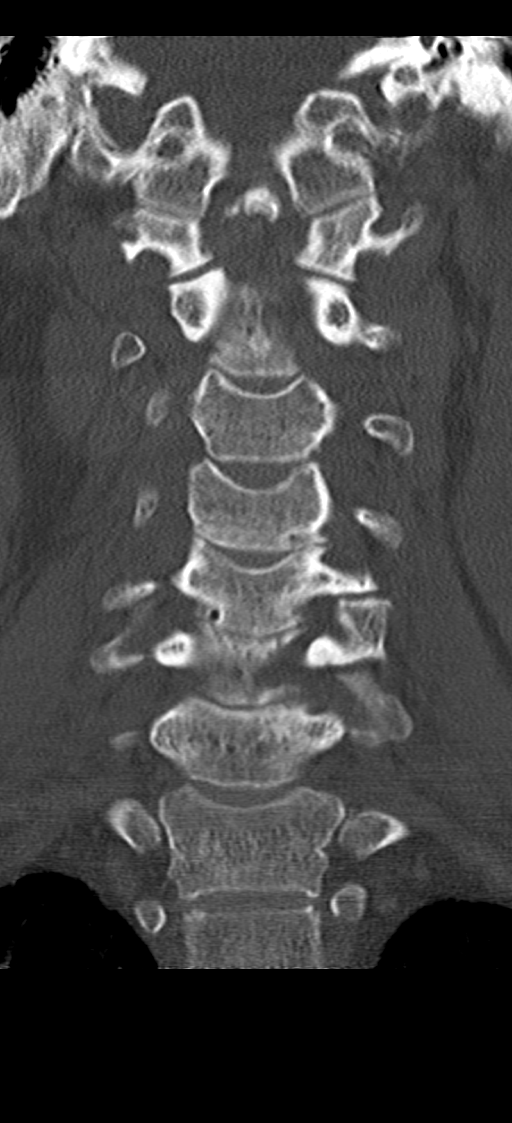
[im 37/61  bone]
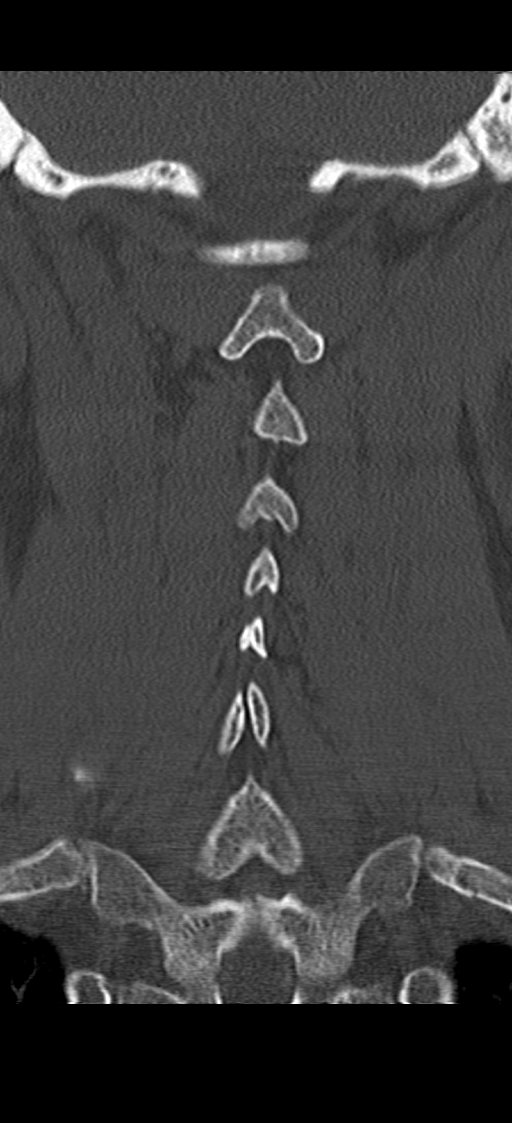

[Series 9: sagittal bone · sagittal · 0.23mm/px · 5 of 50 slices shown, 6 images]
[im 17/50  bone]
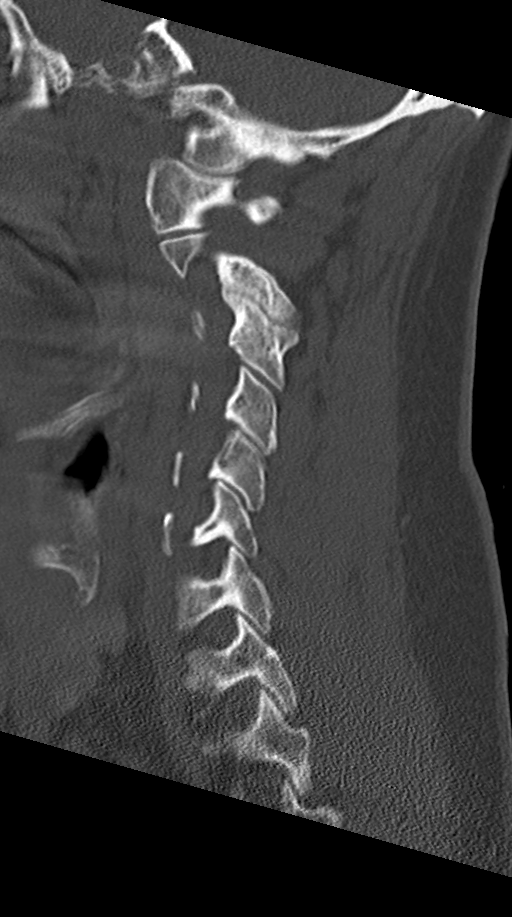
[im 21/50  bone]
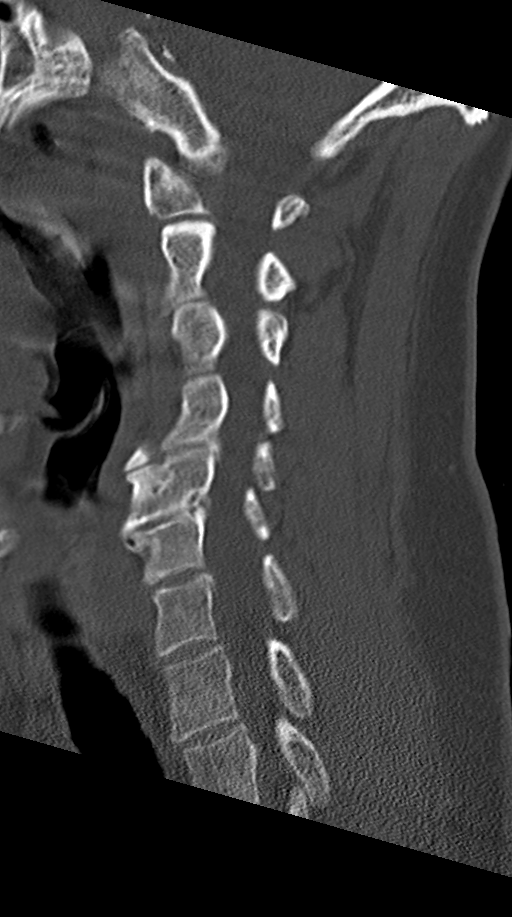
[im 25/50  soft-tissue]
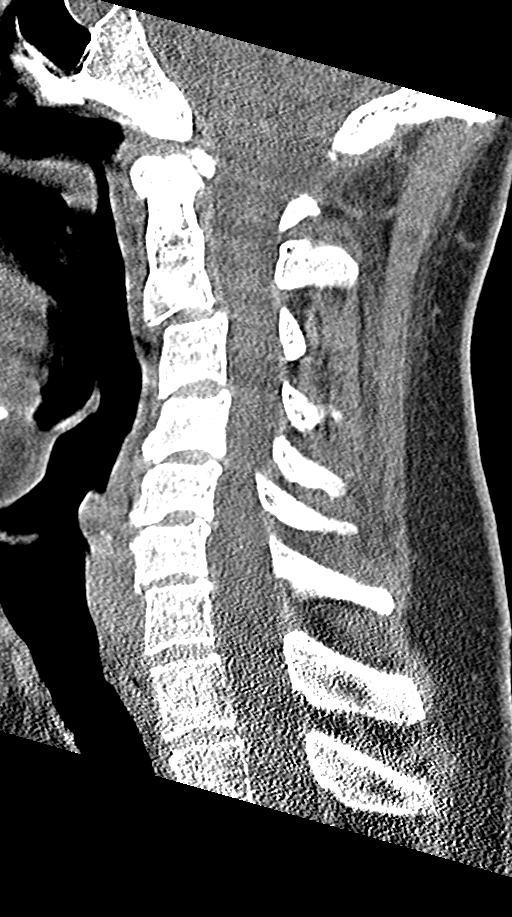
[im 25/50  bone]
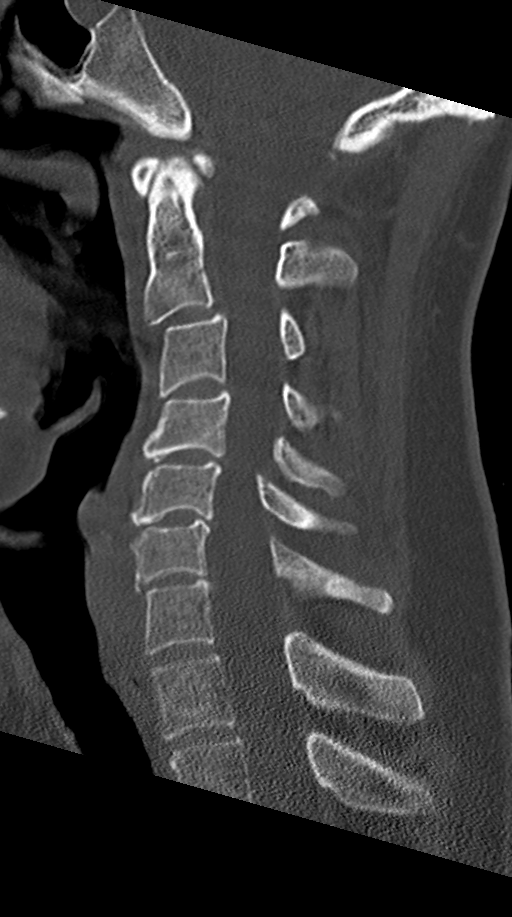
[im 29/50  bone]
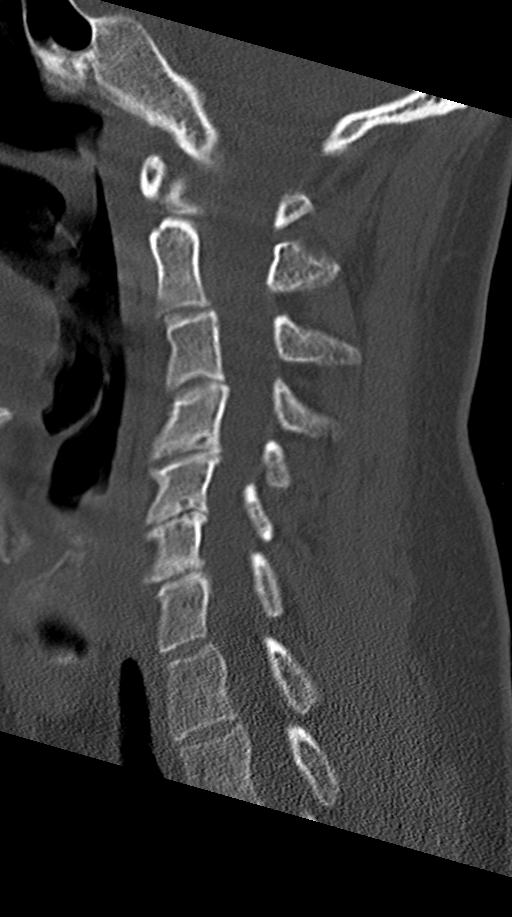
[im 33/50  bone]
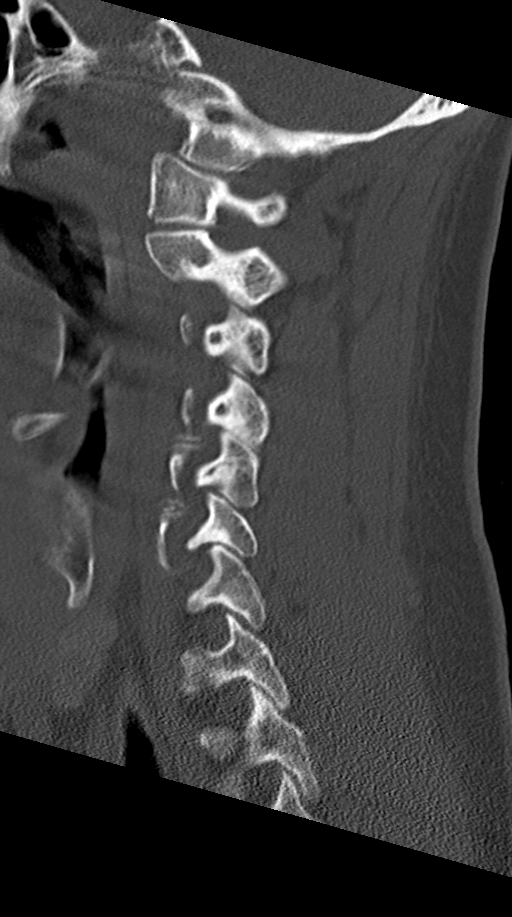

[9 of 33 positions shown; findings below may reference images not displayed]

FINDINGS: CT HEAD FINDINGS

Brain: Cavum septum pellucidum. Otherwise normal ventricular
morphology. No midline shift or mass effect. Normal appearance of
brain parenchyma. No intracranial hemorrhage, mass lesion, or
evidence of acute infarction. No extra-axial fluid collections.

Vascular: No hyperdense vessels.

Skull: Intact

Sinuses/Orbits: Clear

Other: N/A

CT CERVICAL SPINE FINDINGS

Alignment: Mild anterolisthesis at C2-C3, new, with a significant
facet degenerative changes RIGHT C2-C3. Remaining alignments normal.

Skull base and vertebrae: Vertebral body heights maintained. Disc
space narrowing and endplate spur formation at C4-C5 and C5-C6, less
C6-C7. No fracture, additional subluxation, or bone destruction.
Additional minor scattered facet degenerative changes. Skull base
intact. Incidentally noted spina bifida occulta C7, developmental
anomaly.

Soft tissues and spinal canal: Prevertebral soft tissues normal
thickness.

Disc levels:  No specific abnormalities

Upper chest: Lung apices clear

Other: N/A
IMPRESSION: No acute intracranial abnormalities.

Degenerative disc and facet disease changes of the cervical spine as
above.

New mild anterolisthesis at C2-C3, likely degenerative.

No acute cervical spine abnormalities.

## 2022-04-23 DIAGNOSIS — M170 Bilateral primary osteoarthritis of knee: Secondary | ICD-10-CM | POA: Diagnosis not present

## 2022-08-29 DIAGNOSIS — H90A32 Mixed conductive and sensorineural hearing loss, unilateral, left ear with restricted hearing on the contralateral side: Secondary | ICD-10-CM | POA: Diagnosis not present

## 2022-08-29 DIAGNOSIS — H6983 Other specified disorders of Eustachian tube, bilateral: Secondary | ICD-10-CM | POA: Diagnosis not present

## 2022-08-29 DIAGNOSIS — H7412 Adhesive left middle ear disease: Secondary | ICD-10-CM | POA: Diagnosis not present

## 2022-08-29 DIAGNOSIS — H6062 Unspecified chronic otitis externa, left ear: Secondary | ICD-10-CM | POA: Diagnosis not present

## 2022-08-29 DIAGNOSIS — Z9089 Acquired absence of other organs: Secondary | ICD-10-CM | POA: Diagnosis not present

## 2022-08-29 DIAGNOSIS — H6993 Unspecified Eustachian tube disorder, bilateral: Secondary | ICD-10-CM | POA: Diagnosis not present

## 2022-08-29 DIAGNOSIS — M170 Bilateral primary osteoarthritis of knee: Secondary | ICD-10-CM | POA: Diagnosis not present

## 2022-08-29 DIAGNOSIS — Z9889 Other specified postprocedural states: Secondary | ICD-10-CM | POA: Diagnosis not present

## 2022-08-29 DIAGNOSIS — Q909 Down syndrome, unspecified: Secondary | ICD-10-CM | POA: Diagnosis not present

## 2022-08-29 DIAGNOSIS — H608X2 Other otitis externa, left ear: Secondary | ICD-10-CM | POA: Diagnosis not present

## 2022-08-29 DIAGNOSIS — H6123 Impacted cerumen, bilateral: Secondary | ICD-10-CM | POA: Diagnosis not present

## 2022-11-12 DIAGNOSIS — M79644 Pain in right finger(s): Secondary | ICD-10-CM | POA: Diagnosis not present

## 2022-11-12 DIAGNOSIS — M25561 Pain in right knee: Secondary | ICD-10-CM | POA: Diagnosis not present

## 2022-11-12 DIAGNOSIS — M25562 Pain in left knee: Secondary | ICD-10-CM | POA: Diagnosis not present

## 2023-02-09 DIAGNOSIS — H25033 Anterior subcapsular polar age-related cataract, bilateral: Secondary | ICD-10-CM | POA: Diagnosis not present

## 2023-02-09 DIAGNOSIS — H53143 Visual discomfort, bilateral: Secondary | ICD-10-CM | POA: Diagnosis not present

## 2023-02-09 DIAGNOSIS — H52223 Regular astigmatism, bilateral: Secondary | ICD-10-CM | POA: Diagnosis not present

## 2023-02-09 DIAGNOSIS — H5213 Myopia, bilateral: Secondary | ICD-10-CM | POA: Diagnosis not present

## 2023-02-24 DIAGNOSIS — Z79899 Other long term (current) drug therapy: Secondary | ICD-10-CM | POA: Diagnosis not present

## 2023-02-24 DIAGNOSIS — Z8547 Personal history of malignant neoplasm of testis: Secondary | ICD-10-CM | POA: Diagnosis not present

## 2023-02-24 DIAGNOSIS — M1990 Unspecified osteoarthritis, unspecified site: Secondary | ICD-10-CM | POA: Diagnosis not present

## 2023-02-24 DIAGNOSIS — E559 Vitamin D deficiency, unspecified: Secondary | ICD-10-CM | POA: Diagnosis not present

## 2023-02-24 DIAGNOSIS — D72819 Decreased white blood cell count, unspecified: Secondary | ICD-10-CM | POA: Diagnosis not present

## 2023-02-24 DIAGNOSIS — Q909 Down syndrome, unspecified: Secondary | ICD-10-CM | POA: Diagnosis not present

## 2023-02-24 DIAGNOSIS — Z1159 Encounter for screening for other viral diseases: Secondary | ICD-10-CM | POA: Diagnosis not present

## 2023-02-24 DIAGNOSIS — Z0000 Encounter for general adult medical examination without abnormal findings: Secondary | ICD-10-CM | POA: Diagnosis not present

## 2023-02-24 DIAGNOSIS — Z1211 Encounter for screening for malignant neoplasm of colon: Secondary | ICD-10-CM | POA: Diagnosis not present

## 2023-02-24 DIAGNOSIS — Z23 Encounter for immunization: Secondary | ICD-10-CM | POA: Diagnosis not present

## 2023-05-18 ENCOUNTER — Encounter: Payer: Self-pay | Admitting: Pediatrics

## 2023-05-28 ENCOUNTER — Ambulatory Visit: Payer: Medicare Other

## 2023-05-28 VITALS — Ht 63.0 in | Wt 125.0 lb

## 2023-05-28 DIAGNOSIS — Z1211 Encounter for screening for malignant neoplasm of colon: Secondary | ICD-10-CM

## 2023-05-28 MED ORDER — SUFLAVE 178.7 G PO SOLR: 1.0000 | Freq: Once | ORAL | 0 refills | Status: AC

## 2023-05-28 MED ORDER — ONDANSETRON HCL 4 MG PO TABS: 4.0000 mg | ORAL_TABLET | ORAL | 0 refills | Status: AC

## 2023-05-28 NOTE — Progress Notes (Signed)

## 2023-06-05 DIAGNOSIS — H6042 Cholesteatoma of left external ear: Secondary | ICD-10-CM | POA: Diagnosis not present

## 2023-06-05 DIAGNOSIS — H60392 Other infective otitis externa, left ear: Secondary | ICD-10-CM | POA: Diagnosis not present

## 2023-06-05 DIAGNOSIS — H7412 Adhesive left middle ear disease: Secondary | ICD-10-CM | POA: Diagnosis not present

## 2023-06-05 DIAGNOSIS — H90A32 Mixed conductive and sensorineural hearing loss, unilateral, left ear with restricted hearing on the contralateral side: Secondary | ICD-10-CM | POA: Diagnosis not present

## 2023-06-05 DIAGNOSIS — H6993 Unspecified Eustachian tube disorder, bilateral: Secondary | ICD-10-CM | POA: Diagnosis not present

## 2023-06-05 DIAGNOSIS — Q909 Down syndrome, unspecified: Secondary | ICD-10-CM | POA: Diagnosis not present

## 2023-06-05 DIAGNOSIS — M170 Bilateral primary osteoarthritis of knee: Secondary | ICD-10-CM | POA: Diagnosis not present

## 2023-06-05 DIAGNOSIS — H9193 Unspecified hearing loss, bilateral: Secondary | ICD-10-CM | POA: Diagnosis not present

## 2023-06-05 DIAGNOSIS — Z9089 Acquired absence of other organs: Secondary | ICD-10-CM | POA: Diagnosis not present

## 2023-06-16 DIAGNOSIS — M25562 Pain in left knee: Secondary | ICD-10-CM | POA: Diagnosis not present

## 2023-06-16 DIAGNOSIS — G8929 Other chronic pain: Secondary | ICD-10-CM | POA: Diagnosis not present

## 2023-06-16 DIAGNOSIS — M25542 Pain in joints of left hand: Secondary | ICD-10-CM | POA: Diagnosis not present

## 2023-06-16 DIAGNOSIS — M25561 Pain in right knee: Secondary | ICD-10-CM | POA: Diagnosis not present

## 2023-06-16 DIAGNOSIS — M25541 Pain in joints of right hand: Secondary | ICD-10-CM | POA: Diagnosis not present

## 2023-06-16 DIAGNOSIS — M2550 Pain in unspecified joint: Secondary | ICD-10-CM | POA: Diagnosis not present

## 2023-06-19 ENCOUNTER — Telehealth: Payer: Self-pay | Admitting: Pediatrics

## 2023-06-19 NOTE — Telephone Encounter (Signed)
 Patient mother called and stated that she has not received her son's prep medication. Patient mother stated that she has spoke to [facility] but they have not responded to her. Patient mother is requesting a call back. Please advise.

## 2023-06-19 NOTE — Telephone Encounter (Signed)
 Called and spoke with patient's mother, DPR verified, states she has been able to get in touch with [facility] since calling into the office;

## 2023-06-23 NOTE — Progress Notes (Signed)
 [facility] History and Physical   Primary Care Physician:  Mila Palmer, MD   Reason for Procedure:  Colon cancer screening  Plan:    Screening colonoscopy  HPI: Justin Savage is a 48 y.o. male undergoing screening colonoscopy for colon cancer screening.  This is the patient's first colonoscopy.  There is a pertinent family history of colon polyps in the patient's mother, father and paternal grandmother.  Paternal grandmother was also diagnosed with colorectal cancer.  Patient currently denies symptoms of rectal bleeding or change in bowel habits.   Past Medical History:  Diagnosis Date   Arthritis    Malignant neoplasm of other and unspecified testis     Past Surgical History:  Procedure Laterality Date   CHOLESTEATOMA EXCISION Left 2000   2000 & 2021   CLUB FOOT RELEASE  1980   15months and at 48 years old   HYDROCELE EXCISION / REPAIR  1986   age 89   HYPOSPADIAS CORRECTION     testicular cancer  2008   TYMPANOSTOMY TUBE PLACEMENT Bilateral 1982   1982 & 1985    Prior to Admission medications   Medication Sig Start Date End Date Taking? Authorizing Provider  Ascorbic Acid (VITAMIN C) 100 MG tablet Take 100 mg by mouth daily.    [provider]  cholecalciferol (VITAMIN D) 1000 units tablet Take 1,000 Units by mouth daily.    [provider]  ibuprofen (ADVIL) 200 MG tablet Take 200 mg by mouth every 6 (six) hours as needed.    [provider]  Multiple Vitamin (MULTIVITAMINS PO) Take 1 tablet by mouth daily.    [provider]  ondansetron (ZOFRAN) 4 MG tablet Take 1 tablet (4 mg total) by mouth as directed. Take one Zofran 4 mg tablet 30-60 minutes before each colonoscopy prep dose 05/28/23   Bladimir Auman, Durene Romans, MD  oseltamivir (TAMIFLU) 75 MG capsule Take 1 capsule (75 mg total) by mouth every 12 (twelve) hours. Patient not taking: Reported on 05/28/2023 08/09/20   Gailen Shelter, PA    Current Outpatient Medications   Medication Sig Dispense Refill   cholecalciferol (VITAMIN D) 1000 units tablet Take 1,000 Units by mouth daily.     Multiple Vitamin (MULTIVITAMINS PO) Take 1 tablet by mouth daily.     ondansetron (ZOFRAN) 4 MG tablet Take 1 tablet (4 mg total) by mouth as directed. Take one Zofran 4 mg tablet 30-60 minutes before each colonoscopy prep dose 4 tablet 0   Ascorbic Acid (VITAMIN C) 100 MG tablet Take 100 mg by mouth daily.     ibuprofen (ADVIL) 200 MG tablet Take 200 mg by mouth every 6 (six) hours as needed.     oseltamivir (TAMIFLU) 75 MG capsule Take 1 capsule (75 mg total) by mouth every 12 (twelve) hours. (Patient not taking: Reported on 05/28/2023) 10 capsule 0   Current Facility-Administered Medications  Medication Dose Route Frequency Provider Last Rate Last Admin   0.9 %  sodium chloride infusion  500 mL Intravenous Once Ottie Glazier, MD        Allergies as of 06/26/2023 - Review Complete 06/26/2023  Allergen Reaction Noted   Red dye #40 (allura red) Other (See Comments) 01/08/2020    Family History  Problem Relation Age of Onset   Colon polyps Mother    Colon polyps Father    Breast cancer Maternal Aunt    Colon polyps Paternal Grandmother    Colon cancer Paternal Grandmother  Esophageal cancer Neg Hx    Rectal cancer Neg Hx    Stomach cancer Neg Hx     Social History   Socioeconomic History   Marital status: Single    Spouse name: Not on file   Number of children: Not on file   Years of education: Not on file   Highest education level: Not on file  Occupational History   Not on file  Tobacco Use   Smoking status: Never   Smokeless tobacco: Not on file  Vaping Use   Vaping status: Never Used  Substance and Sexual Activity   Alcohol use: Never   Drug use: Never   Sexual activity: Never  Other Topics Concern   Not on file  Social History Narrative   Not on file   Social Drivers of Health   Financial Resource Strain: Not on file  Food Insecurity:  Low Risk  (06/05/2023)   Received from [facility]   Hunger Vital Sign    Worried About Running Out of Food in the Last Year: Never true    Ran Out of Food in the Last Year: Never true  Transportation Needs: No Transportation Needs (06/05/2023)   Received from [facility]    In the past 12 months, has lack of reliable transportation kept you from medical appointments, meetings, work or from getting things needed for daily living? : No  Physical Activity: Not on file  Stress: Not on file  Social Connections: Not on file  Intimate Partner Violence: Not on file    Review of Systems:  All other review of systems negative except as mentioned in the HPI.  Physical Exam: Vital signs BP 112/68   Pulse 89   Temp (!) 97.2 F (36.2 C)   Resp 16   Ht 5\' 3"  (1.6 m)   Wt 125 lb (56.7 kg)   SpO2 100%   BMI 22.14 kg/m   General:   Alert,  Well-developed, well-nourished, pleasant and cooperative in NAD Airway:  Mallampati 2 Lungs:  Clear throughout to auscultation.   Heart:  Regular rate and rhythm; no murmurs, clicks, rubs,  or gallops. Abdomen:  Soft, nontender and nondistended. Normal bowel sounds.   Neuro/Psych:  Normal mood and affect. A and O x 3  Maren Beach, MD [facility]

## 2023-06-26 ENCOUNTER — Ambulatory Visit: Payer: Medicare Other | Admitting: Pediatrics

## 2023-06-26 ENCOUNTER — Encounter: Payer: Self-pay | Admitting: Pediatrics

## 2023-06-26 VITALS — BP 89/60 | HR 90 | Temp 97.2°F | Resp 15 | Ht 63.0 in | Wt 125.0 lb

## 2023-06-26 DIAGNOSIS — Z83719 Family history of colon polyps, unspecified: Secondary | ICD-10-CM | POA: Diagnosis not present

## 2023-06-26 DIAGNOSIS — Z800 Family history of malignant neoplasm of digestive organs: Secondary | ICD-10-CM

## 2023-06-26 DIAGNOSIS — Z1211 Encounter for screening for malignant neoplasm of colon: Secondary | ICD-10-CM

## 2023-06-26 MED ORDER — SODIUM CHLORIDE 0.9 % IV SOLN: 500.0000 mL | Freq: Once | INTRAVENOUS | Status: DC

## 2023-06-26 NOTE — Patient Instructions (Signed)

## 2023-06-26 NOTE — Op Note (Signed)
 [facility] Patient Name: Justin Savage Procedure Date: 06/26/2023 9:37 AM MRN: [id number] Endoscopist: Maren Beach , MD, [id number] Age: 48 Referring MD:  Date of Birth: 01-05-1976 Gender: Male Account #: [id number] Procedure:                Colonoscopy Indications:              Colon cancer screening in patient at increased                            risk: Family history of colon polyps in multiple                            1st-degree relatives, Family history of colon                            cancer in paternal grandmother > 60 years, This is                            the patient's first colonoscopy Medicines:                Monitored Anesthesia Care Procedure:                Pre-Anesthesia Assessment:                           - Prior to the procedure, a History and Physical                            was performed, and patient medications and                            allergies were reviewed. The patient's tolerance of                            previous anesthesia was also reviewed. The risks                            and benefits of the procedure and the sedation                            options and risks were discussed with the patient.                            All questions were answered, and informed consent                            was obtained. Prior Anticoagulants: The patient has                            taken no anticoagulant or antiplatelet agents. ASA                            Grade Assessment: II - A patient with mild systemic  disease. After reviewing the risks and benefits,                            the patient was deemed in satisfactory condition to                            undergo the procedure.                           After obtaining informed consent, the colonoscope                            was passed under direct vision. Throughout the                            procedure, the patient's blood  pressure, pulse, and                            oxygen saturations were monitored continuously. The                            Olympus Scope [id number] was introduced through the                            anus and advanced to the cecum, identified by                            appendiceal orifice and ileocecal valve. The                            colonoscopy was performed without difficulty. The                            patient tolerated the procedure well. The quality                            of the bowel preparation was adequate. The                            ileocecal valve, appendiceal orifice, and rectum                            were photographed. Scope In: 9:50:41 AM Scope Out: 10:14:47 AM Scope Withdrawal Time: 0 hours 11 minutes 25 seconds  Total Procedure Duration: 0 hours 24 minutes 6 seconds  Findings:                 The perianal and digital rectal examinations were                            normal. Pertinent negatives include normal                            sphincter tone.  The colon (entire examined portion) appeared normal.                           The retroflexed view of the distal rectum and anal                            verge was normal and showed no anal or rectal                            abnormalities. Complications:            No immediate complications. Estimated blood loss:                            None. Estimated Blood Loss:     Estimated blood loss: none. Impression:               - The entire examined colon is normal.                           - The distal rectum and anal verge are normal on                            retroflexion view.                           - No specimens collected. Recommendation:           - Discharge patient to home (ambulatory).                           - Repeat colonoscopy in 10 years for screening                            purposes.                           - The findings and  recommendations were discussed                            with the patient's family.                           - Return to referring physician.                           - Patient has a contact number available for                            emergencies. The signs and symptoms of potential                            delayed complications were discussed with the                            patient. Return to normal activities tomorrow.  Written discharge instructions were provided to the                            patient. Maren Beach, MD 06/26/2023 10:19:11 AM This report has been signed electronically.

## 2023-06-26 NOTE — Progress Notes (Signed)
 Sedate, gd SR, tolerated procedure well, VSS, report to RN

## 2023-06-26 NOTE — Progress Notes (Signed)
 Pt's states no medical or surgical changes since previsit or office visit.

## 2023-06-29 ENCOUNTER — Telehealth: Payer: Self-pay

## 2023-06-29 NOTE — Telephone Encounter (Signed)
 Error

## 2023-06-29 NOTE — Telephone Encounter (Signed)
  Follow up Call-     06/26/2023    9:03 AM  Call back number  Post procedure Call Back phone  # call mom [phone]  Permission to leave phone message Yes     Patient questions:  Do you have a fever, pain , or abdominal swelling? No. Pain Score  0 *  Have you tolerated food without any problems? Yes.    Have you been able to return to your normal activities? Yes.    Do you have any questions about your discharge instructions: Diet   No. Medications  No. Follow up visit  No.  Do you have questions or concerns about your Care? No.  Actions: * If pain score is 4 or above: No action needed, pain <4.

## 2023-12-03 DIAGNOSIS — Q909 Down syndrome, unspecified: Secondary | ICD-10-CM | POA: Diagnosis not present

## 2023-12-03 DIAGNOSIS — R011 Cardiac murmur, unspecified: Secondary | ICD-10-CM | POA: Diagnosis not present

## 2023-12-03 DIAGNOSIS — M1710 Unilateral primary osteoarthritis, unspecified knee: Secondary | ICD-10-CM | POA: Diagnosis not present

## 2023-12-04 DIAGNOSIS — H60392 Other infective otitis externa, left ear: Secondary | ICD-10-CM | POA: Diagnosis not present

## 2023-12-04 DIAGNOSIS — H7412 Adhesive left middle ear disease: Secondary | ICD-10-CM | POA: Diagnosis not present

## 2023-12-04 DIAGNOSIS — H90A32 Mixed conductive and sensorineural hearing loss, unilateral, left ear with restricted hearing on the contralateral side: Secondary | ICD-10-CM | POA: Diagnosis not present

## 2023-12-04 DIAGNOSIS — H6123 Impacted cerumen, bilateral: Secondary | ICD-10-CM | POA: Diagnosis not present

## 2023-12-04 DIAGNOSIS — H6042 Cholesteatoma of left external ear: Secondary | ICD-10-CM | POA: Diagnosis not present

## 2023-12-04 DIAGNOSIS — Z9089 Acquired absence of other organs: Secondary | ICD-10-CM | POA: Diagnosis not present

## 2023-12-04 DIAGNOSIS — H6993 Unspecified Eustachian tube disorder, bilateral: Secondary | ICD-10-CM | POA: Diagnosis not present

## 2023-12-04 DIAGNOSIS — Q909 Down syndrome, unspecified: Secondary | ICD-10-CM | POA: Diagnosis not present

## 2023-12-04 DIAGNOSIS — H6121 Impacted cerumen, right ear: Secondary | ICD-10-CM | POA: Diagnosis not present

## 2024-02-10 DIAGNOSIS — H5213 Myopia, bilateral: Secondary | ICD-10-CM | POA: Diagnosis not present

## 2024-02-10 DIAGNOSIS — H53143 Visual discomfort, bilateral: Secondary | ICD-10-CM | POA: Diagnosis not present

## 2024-02-10 DIAGNOSIS — H25033 Anterior subcapsular polar age-related cataract, bilateral: Secondary | ICD-10-CM | POA: Diagnosis not present

## 2024-02-10 DIAGNOSIS — H52223 Regular astigmatism, bilateral: Secondary | ICD-10-CM | POA: Diagnosis not present
